# Patient Record
Sex: Male | Born: 1945
Health system: Southern US, Community
[De-identification: ages and names within clinical notes are randomized; demographics above are authoritative.]

## PROBLEM LIST (undated history)

## (undated) DIAGNOSIS — I2510 Atherosclerotic heart disease of native coronary artery without angina pectoris: Secondary | ICD-10-CM

## (undated) DIAGNOSIS — E119 Type 2 diabetes mellitus without complications: Secondary | ICD-10-CM

## (undated) DIAGNOSIS — D649 Anemia, unspecified: Secondary | ICD-10-CM

## (undated) DIAGNOSIS — R0600 Dyspnea, unspecified: Secondary | ICD-10-CM

## (undated) HISTORY — PX: APPENDECTOMY: SHX54

## (undated) HISTORY — PX: CORONARY ANGIOPLASTY: SHX604

## (undated) HISTORY — PX: CARDIAC CATHETERIZATION: SHX172

---

## 2011-09-16 ENCOUNTER — Emergency Department (HOSPITAL_COMMUNITY): Payer: Medicare Other

## 2011-09-16 ENCOUNTER — Encounter (HOSPITAL_COMMUNITY): Payer: Self-pay | Admitting: Emergency Medicine

## 2011-09-16 ENCOUNTER — Emergency Department (HOSPITAL_COMMUNITY)
Admission: EM | Admit: 2011-09-16 | Discharge: 2011-09-16 | Disposition: A | Discharge status: Home / Self Care | Payer: Medicare Other | Attending: Emergency Medicine | Admitting: Emergency Medicine

## 2011-09-16 DIAGNOSIS — Z9089 Acquired absence of other organs: Secondary | ICD-10-CM | POA: Insufficient documentation

## 2011-09-16 DIAGNOSIS — Y92410 Unspecified street and highway as the place of occurrence of the external cause: Secondary | ICD-10-CM | POA: Insufficient documentation

## 2011-09-16 DIAGNOSIS — I10 Essential (primary) hypertension: Secondary | ICD-10-CM | POA: Insufficient documentation

## 2011-09-16 DIAGNOSIS — I160 Hypertensive urgency: Secondary | ICD-10-CM

## 2011-09-16 LAB — BASIC METABOLIC PANEL
BUN: 11 mg/dL (ref 6–23)
Calcium: 9.5 mg/dL (ref 8.4–10.5)
Creatinine, Ser: 0.79 mg/dL (ref 0.50–1.35)
GFR calc non Af Amer: >90 mL/min (ref >90)
Glucose, Bld: 109 mg/dL — ABNORMAL HIGH (ref 70–99)
Potassium: 3.8 mEq/L (ref 3.5–5.1)

## 2011-09-16 LAB — CBC WITH DIFFERENTIAL/PLATELET
Eosinophils Absolute: 0.1 10*3/uL (ref 0.0–0.7)
Eosinophils Relative: 1 % (ref 0–5)
Hemoglobin: 12 g/dL — ABNORMAL LOW (ref 13.0–17.0)
Lymphs Abs: 2 10*3/uL (ref 0.7–4.0)
MCH: 26.8 pg (ref 26.0–34.0)
MCV: 80.8 fL (ref 78.0–100.0)
Monocytes Absolute: 0.6 10*3/uL (ref 0.1–1.0)
Monocytes Relative: 8 % (ref 3–12)
RBC: 4.47 MIL/uL (ref 4.22–5.81)

## 2011-09-16 LAB — URINALYSIS, ROUTINE W REFLEX MICROSCOPIC
Bilirubin Urine: NEGATIVE
Hgb urine dipstick: NEGATIVE
Specific Gravity, Urine: 1.023 (ref 1.005–1.030)
Urobilinogen, UA: 0.2 mg/dL (ref 0.0–1.0)

## 2011-09-16 MED ORDER — CLONIDINE HCL 0.1 MG PO TABS: 0.1000 mg | ORAL_TABLET | Freq: Once | ORAL | Status: DC

## 2011-09-16 MED ORDER — MORPHINE SULFATE 4 MG/ML IJ SOLN
4.0000 mg | Freq: Once | INTRAMUSCULAR | Status: AC
  Administered 2011-09-16: 4 mg via INTRAVENOUS
  Filled 2011-09-16: qty 1

## 2011-09-16 MED ORDER — CLONIDINE HCL 0.1 MG PO TABS
0.2000 mg | ORAL_TABLET | Freq: Once | ORAL | Status: AC
  Administered 2011-09-16: 0.2 mg via ORAL
  Filled 2011-09-16: qty 2

## 2011-09-16 MED ORDER — LISINOPRIL 20 MG PO TABS: 10.0000 mg | ORAL_TABLET | Freq: Every day | ORAL | Status: DC
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
## 2011-09-16 NOTE — ED Notes (Signed)
RN to obtain labs with start of IV 
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
## 2014-08-18 ENCOUNTER — Other Ambulatory Visit: Payer: Self-pay | Admitting: Cardiovascular Disease

## 2014-08-18 DIAGNOSIS — R1031 Right lower quadrant pain: Secondary | ICD-10-CM

## 2014-08-19 ENCOUNTER — Ambulatory Visit
Admission: RE | Admit: 2014-08-19 | Discharge: 2014-08-19 | Disposition: A | Discharge status: Home / Self Care | Payer: Medicare Other | Source: Ambulatory Visit | Attending: Cardiovascular Disease | Admitting: Cardiovascular Disease

## 2014-08-19 DIAGNOSIS — R1031 Right lower quadrant pain: Secondary | ICD-10-CM

## 2016-10-17 ENCOUNTER — Inpatient Hospital Stay (HOSPITAL_COMMUNITY)
Admission: AD | Admit: 2016-10-17 | Discharge: 2016-10-19 | DRG: 247 | Disposition: A | Discharge status: Home / Self Care | Payer: Medicare Other | Source: Ambulatory Visit | Attending: Cardiovascular Disease | Admitting: Cardiovascular Disease

## 2016-10-17 DIAGNOSIS — Z79899 Other long term (current) drug therapy: Secondary | ICD-10-CM

## 2016-10-17 DIAGNOSIS — Z87891 Personal history of nicotine dependence: Secondary | ICD-10-CM

## 2016-10-17 DIAGNOSIS — R072 Precordial pain: Secondary | ICD-10-CM

## 2016-10-17 DIAGNOSIS — I249 Acute ischemic heart disease, unspecified: Secondary | ICD-10-CM | POA: Diagnosis present

## 2016-10-17 DIAGNOSIS — E119 Type 2 diabetes mellitus without complications: Secondary | ICD-10-CM | POA: Diagnosis present

## 2016-10-17 DIAGNOSIS — Z8249 Family history of ischemic heart disease and other diseases of the circulatory system: Secondary | ICD-10-CM

## 2016-10-17 DIAGNOSIS — I25110 Atherosclerotic heart disease of native coronary artery with unstable angina pectoris: Principal | ICD-10-CM | POA: Diagnosis present

## 2016-10-17 DIAGNOSIS — R9439 Abnormal result of other cardiovascular function study: Secondary | ICD-10-CM | POA: Diagnosis present

## 2016-10-17 DIAGNOSIS — Z823 Family history of stroke: Secondary | ICD-10-CM

## 2016-10-17 DIAGNOSIS — I10 Essential (primary) hypertension: Secondary | ICD-10-CM

## 2016-10-17 DIAGNOSIS — Z955 Presence of coronary angioplasty implant and graft: Secondary | ICD-10-CM

## 2016-10-17 HISTORY — DX: Type 2 diabetes mellitus without complications: E11.9

## 2016-10-17 HISTORY — DX: Atherosclerotic heart disease of native coronary artery without angina pectoris: I25.10

## 2016-10-17 LAB — CBC WITH DIFFERENTIAL/PLATELET
Basophils Absolute: 0 10*3/uL (ref 0.0–0.1)
Basophils Relative: 0 %
EOS ABS: 0.1 10*3/uL (ref 0.0–0.7)
EOS PCT: 1 %
HCT: 34.1 % — ABNORMAL LOW (ref 39.0–52.0)
Hemoglobin: 11.4 g/dL — ABNORMAL LOW (ref 13.0–17.0)
LYMPHS ABS: 1.9 10*3/uL (ref 0.7–4.0)
Lymphocytes Relative: 23 %
MCH: 29.5 pg (ref 26.0–34.0)
MCHC: 33.4 g/dL (ref 30.0–36.0)
MCV: 88.3 fL (ref 78.0–100.0)
MONO ABS: 0.6 10*3/uL (ref 0.1–1.0)
MONOS PCT: 7 %
Neutro Abs: 5.6 10*3/uL (ref 1.7–7.7)
Neutrophils Relative %: 69 %
PLATELETS: 248 10*3/uL (ref 150–400)
RBC: 3.86 MIL/uL — ABNORMAL LOW (ref 4.22–5.81)
RDW: 15.5 % (ref 11.5–15.5)
WBC: 8.2 10*3/uL (ref 4.0–10.5)

## 2016-10-17 LAB — PROTIME-INR
INR: 1.01
Prothrombin Time: 13.3 seconds (ref 11.4–15.2)

## 2016-10-17 LAB — COMPREHENSIVE METABOLIC PANEL
ALK PHOS: 64 U/L (ref 38–126)
ALT: 14 U/L — AB (ref 17–63)
AST: 20 U/L (ref 15–41)
Albumin: 3.9 g/dL (ref 3.5–5.0)
Anion gap: 12 (ref 5–15)
BUN: 11 mg/dL (ref 6–20)
CALCIUM: 9.6 mg/dL (ref 8.9–10.3)
CO2: 20 mmol/L — AB (ref 22–32)
CREATININE: 0.95 mg/dL (ref 0.61–1.24)
Chloride: 107 mmol/L (ref 101–111)
GFR calc non Af Amer: >60 mL/min (ref >60)
GLUCOSE: 93 mg/dL (ref 65–99)
Potassium: 4.2 mmol/L (ref 3.5–5.1)
SODIUM: 139 mmol/L (ref 135–145)
Total Bilirubin: 0.5 mg/dL (ref 0.3–1.2)
Total Protein: 7.3 g/dL (ref 6.5–8.1)

## 2016-10-17 LAB — HEMOGLOBIN A1C
Hgb A1c MFr Bld: 6.2 % — ABNORMAL HIGH (ref 4.8–5.6)
MEAN PLASMA GLUCOSE: 131.24 mg/dL

## 2016-10-17 LAB — APTT: APTT: 30 s (ref 24–36)

## 2016-10-17 LAB — TROPONIN I: Troponin I: <0.03 ng/mL (ref <0.03)

## 2016-10-17 LAB — GLUCOSE, CAPILLARY: GLUCOSE-CAPILLARY: 109 mg/dL — AB (ref 65–99)

## 2016-10-17 MED ORDER — LOSARTAN POTASSIUM 50 MG PO TABS
100.0000 mg | ORAL_TABLET | Freq: Every day | ORAL | Status: DC
  Administered 2016-10-17 – 2016-10-19 (×3): 100 mg via ORAL
  Filled 2016-10-17 (×3): qty 2

## 2016-10-17 MED ORDER — ONDANSETRON HCL 4 MG/2ML IJ SOLN: 4.0000 mg | Freq: Four times a day (QID) | INTRAMUSCULAR | Status: DC | PRN

## 2016-10-17 MED ORDER — NITROGLYCERIN 0.4 MG SL SUBL: 0.4000 mg | SUBLINGUAL_TABLET | SUBLINGUAL | Status: DC | PRN

## 2016-10-17 MED ORDER — ASPIRIN 300 MG RE SUPP: 300.0000 mg | RECTAL | Status: AC

## 2016-10-17 MED ORDER — HEPARIN BOLUS VIA INFUSION
3000.0000 [IU] | Freq: Once | INTRAVENOUS | Status: AC
  Administered 2016-10-17: 3000 [IU] via INTRAVENOUS
  Filled 2016-10-17: qty 3000

## 2016-10-17 MED ORDER — HEPARIN (PORCINE) IN NACL 100-0.45 UNIT/ML-% IJ SOLN
950.0000 [IU]/h | INTRAMUSCULAR | Status: DC
  Administered 2016-10-17: 800 [IU]/h via INTRAVENOUS
  Filled 2016-10-17: qty 250

## 2016-10-17 MED ORDER — METOPROLOL TARTRATE 12.5 MG HALF TABLET
12.5000 mg | ORAL_TABLET | Freq: Two times a day (BID) | ORAL | Status: DC
  Administered 2016-10-17 – 2016-10-18 (×2): 12.5 mg via ORAL
  Filled 2016-10-17 (×3): qty 1

## 2016-10-17 MED ORDER — INSULIN ASPART 100 UNIT/ML ~~LOC~~ SOLN
0.0000 [IU] | Freq: Three times a day (TID) | SUBCUTANEOUS | Status: DC
  Administered 2016-10-18: 19:00:00 2 [IU] via SUBCUTANEOUS

## 2016-10-17 MED ORDER — ATORVASTATIN CALCIUM 40 MG PO TABS
40.0000 mg | ORAL_TABLET | Freq: Every day | ORAL | Status: DC
  Administered 2016-10-17 – 2016-10-18 (×2): 40 mg via ORAL
  Filled 2016-10-17 (×2): qty 1

## 2016-10-17 MED ORDER — ASPIRIN 81 MG PO CHEW
324.0000 mg | CHEWABLE_TABLET | ORAL | Status: AC
  Administered 2016-10-17: 324 mg via ORAL
  Filled 2016-10-17: qty 4

## 2016-10-17 MED ORDER — LISINOPRIL 10 MG PO TABS
10.0000 mg | ORAL_TABLET | Freq: Every day | ORAL | Status: DC
  Administered 2016-10-17: 10 mg via ORAL
  Filled 2016-10-17: qty 1

## 2016-10-17 MED ORDER — ASPIRIN EC 81 MG PO TBEC
81.0000 mg | DELAYED_RELEASE_TABLET | Freq: Every day | ORAL | Status: DC
  Administered 2016-10-18 – 2016-10-19 (×2): 81 mg via ORAL
  Filled 2016-10-17 (×2): qty 1

## 2016-10-17 MED ORDER — ACETAMINOPHEN 325 MG PO TABS: 650.0000 mg | ORAL_TABLET | ORAL | Status: DC | PRN

## 2016-10-17 NOTE — H&P (Signed)
Referring Physician:  PARLEY PIDCOCK is an 71 y.o. male.                       Chief Complaint: Chest pain  HPI: 71 year old male presented to office for chest pain evaluation. He underwent stress test showing 2 mm ST depression in inferior leads along with sweating and hypotension. He has past medical history of hypertension and DM, II.  Past medical history: DM, II since 2013, Hypertension since 2013. Quit smoking since 1995 with 30 pack year h/o of smoking., Quit alcohol use 1995. No drug use. No hyperlipidemia, No obesity, No MI, No FH of premature CAD.   Past Surgical History:  Procedure Laterality Date  . APPENDECTOMY-1975.      Family history: Mom died of stroke at age 70 years.                           Dad died of prostate cancer at age 87 years.                           He had 3 brothers, one living, one died of pneumonia complication at a young age and other one was found drunk and dead.                           He has 4 sisters and 3 are living with diabetes and hypertension and youngest one died of unknown cause.  Social History:  reports that he has quit smoking. He does not have any smokeless tobacco history on file. He reports that he does not drink alcohol or use drugs.  Allergies: Lisinopril - cough.  Medications Prior to Admission  Medication Sig Dispense Refill  . lisinopril (PRINIVIL,ZESTRIL) 20 MG tablet Take 0.5 tablets (10 mg total) by mouth daily. 15 tablet 0    No results found for this or any previous visit (from the past 48 hour(s)). No results found.  Review Of Systems Constitutional: No fever, chills, weight loss or gain. Eyes: No vision change, wears glasses. No discharge or pain. Ears: No hearing loss, No tinnitus. Respiratory: No asthma, COPD, pneumonias. Positive shortness of breath. No hemoptysis. Cardiovascular: Occasional chest pain, no palpitation, leg edema. Gastrointestinal: No nausea, vomiting, diarrhea, constipation. No GI bleed. No  hepatitis. Genitourinary: No dysuria, hematuria, kidney stone. No incontinance. Neurological: No headache, stroke, seizures.  Psychiatry: No psych facility admission for anxiety, depression, suicide. No detox. Skin: No rash. Musculoskeletal: Positive joint pain, no fibromyalgia. No neck pain, positive back pain. Lymphadenopathy: No lymphadenopathy. Hematology: No anemia or easy bruising.   Blood pressure (!) 154/78, pulse 62, temperature 98.3 F (36.8 C), temperature source Oral, resp. rate 18, height 5\' 4"  (1.626 m), weight 64.5 kg (142 lb 3.2 oz), SpO2 100 %. Body mass index is 24.41 kg/m. General appearance: alert, cooperative, appears stated age and no distress Head: Normocephalic, atraumatic. Eyes: Brown eyes, pink conjunctiva, corneas clear. PERRL, EOM's intact. Neck: No adenopathy, no carotid bruit, no JVD, supple, symmetrical, trachea midline and thyroid not enlarged. Resp: Clear to auscultation bilaterally. Cardio: Regular rate and rhythm, S1, S2 normal, II/VI systolic murmur, no click, rub or gallop GI: Soft, non-tender; bowel sounds normal; no organomegaly. Extremities: No edema, cyanosis or clubbing. Skin: Warm and dry.  Neurologic: Alert and oriented X 3, normal strength. Normal coordination and gait.  Assessment/Plan Acute  coronary syndrome Hypertension DM, II H/O tobacco use disorder  Place in observation. R/O MI Cardiac catheterization in AM.  Birdie Riddle, MD  10/17/2016, 4:21 PM

## 2016-10-17 NOTE — Progress Notes (Signed)
ANTICOAGULATION CONSULT NOTE - Initial Consult  Pharmacy Consult for heparin Indication: chest pain/ACS  No Known Allergies  Patient Measurements: Height: 5\' 4"  (162.6 cm) Weight: 142 lb 3.2 oz (64.5 kg) IBW/kg (Calculated) : 59.2 Heparin Dosing Weight: 64.5 kg  Vital Signs: Temp: 98.3 F (36.8 C) (08/27 1521) Temp Source: Oral (08/27 1521) BP: 154/78 (08/27 1521) Pulse Rate: 62 (08/27 1521)  Labs: No results for input(s): HGB, HCT, PLT, APTT, LABPROT, INR, HEPARINUNFRC, HEPRLOWMOCWT, CREATININE, CKTOTAL, CKMB, TROPONINI in the last 72 hours.  Labs pending.  CBC and BMET were normal July 2013 admission.   CrCl cannot be calculated (Patient's most recent lab result is older than the maximum 21 days allowed.).   Medical History: HTN  Medications:  Prescriptions Prior to Admission  Medication Sig Dispense Refill Last Dose  . lisinopril (PRINIVIL,ZESTRIL) 20 MG tablet Take 0.5 tablets (10 mg total) by mouth daily. 15 tablet 0     Assessment: 71 yo M presented with CP.  Pharmacy asked to start heparin for rule-out MI.  Plans for cardiac cath 8/28.  Goal of Therapy:  Heparin level 0.3-0.7 units/ml Monitor platelets by anticoagulation protocol: Yes   Plan:  Follow-up baseline labs. Heparin 3000 units IV bolus x 1 Heparin infusion at 800 units/hr Heparin level in 6 hours Heparin level and CBC daily while on heparin  [employer], Pharm.D., BCPS Clinical Pharmacist 10/17/2016 4:44 PM

## 2016-10-18 ENCOUNTER — Encounter (HOSPITAL_COMMUNITY): Payer: Self-pay

## 2016-10-18 ENCOUNTER — Encounter (HOSPITAL_COMMUNITY)
Admission: AD | Disposition: A | Discharge status: Home / Self Care | Payer: Self-pay | Source: Ambulatory Visit | Attending: Cardiovascular Disease

## 2016-10-18 DIAGNOSIS — E119 Type 2 diabetes mellitus without complications: Secondary | ICD-10-CM | POA: Diagnosis present

## 2016-10-18 DIAGNOSIS — Z823 Family history of stroke: Secondary | ICD-10-CM | POA: Diagnosis not present

## 2016-10-18 DIAGNOSIS — I25110 Atherosclerotic heart disease of native coronary artery with unstable angina pectoris: Secondary | ICD-10-CM | POA: Diagnosis present

## 2016-10-18 DIAGNOSIS — I10 Essential (primary) hypertension: Secondary | ICD-10-CM | POA: Diagnosis present

## 2016-10-18 DIAGNOSIS — Z79899 Other long term (current) drug therapy: Secondary | ICD-10-CM | POA: Diagnosis not present

## 2016-10-18 DIAGNOSIS — Z8249 Family history of ischemic heart disease and other diseases of the circulatory system: Secondary | ICD-10-CM | POA: Diagnosis not present

## 2016-10-18 DIAGNOSIS — R9439 Abnormal result of other cardiovascular function study: Secondary | ICD-10-CM | POA: Diagnosis present

## 2016-10-18 DIAGNOSIS — Z955 Presence of coronary angioplasty implant and graft: Secondary | ICD-10-CM | POA: Diagnosis not present

## 2016-10-18 DIAGNOSIS — Z87891 Personal history of nicotine dependence: Secondary | ICD-10-CM | POA: Diagnosis not present

## 2016-10-18 DIAGNOSIS — R079 Chest pain, unspecified: Secondary | ICD-10-CM | POA: Diagnosis present

## 2016-10-18 HISTORY — PX: LEFT HEART CATH AND CORONARY ANGIOGRAPHY: CATH118249

## 2016-10-18 HISTORY — PX: CORONARY STENT INTERVENTION: CATH118234

## 2016-10-18 LAB — PROTIME-INR
INR: 1.11
PROTHROMBIN TIME: 14.3 s (ref 11.4–15.2)

## 2016-10-18 LAB — CBC
HEMATOCRIT: 33.3 % — AB (ref 39.0–52.0)
Hemoglobin: 10.9 g/dL — ABNORMAL LOW (ref 13.0–17.0)
MCH: 29.2 pg (ref 26.0–34.0)
MCHC: 32.7 g/dL (ref 30.0–36.0)
MCV: 89.3 fL (ref 78.0–100.0)
Platelets: 262 10*3/uL (ref 150–400)
RBC: 3.73 MIL/uL — ABNORMAL LOW (ref 4.22–5.81)
RDW: 15.2 % (ref 11.5–15.5)
WBC: 7 10*3/uL (ref 4.0–10.5)

## 2016-10-18 LAB — IRON AND TIBC
Iron: 88 ug/dL (ref 45–182)
SATURATION RATIOS: 26 % (ref 17.9–39.5)
TIBC: 336 ug/dL (ref 250–450)
UIBC: 248 ug/dL

## 2016-10-18 LAB — LIPID PANEL
CHOL/HDL RATIO: 3.1 ratio
Cholesterol: 153 mg/dL (ref 0–200)
HDL: 49 mg/dL (ref >40)
LDL Cholesterol: 72 mg/dL (ref 0–99)
TRIGLYCERIDES: 162 mg/dL — AB (ref <150)
VLDL: 32 mg/dL (ref 0–40)

## 2016-10-18 LAB — BASIC METABOLIC PANEL
ANION GAP: 9 (ref 5–15)
BUN: 12 mg/dL (ref 6–20)
CALCIUM: 9.3 mg/dL (ref 8.9–10.3)
CO2: 25 mmol/L (ref 22–32)
Chloride: 105 mmol/L (ref 101–111)
Creatinine, Ser: 1.01 mg/dL (ref 0.61–1.24)
Glucose, Bld: 111 mg/dL — ABNORMAL HIGH (ref 65–99)
Potassium: 3.9 mmol/L (ref 3.5–5.1)
Sodium: 139 mmol/L (ref 135–145)

## 2016-10-18 LAB — GLUCOSE, CAPILLARY
GLUCOSE-CAPILLARY: 89 mg/dL (ref 65–99)
GLUCOSE-CAPILLARY: 138 mg/dL — AB (ref 65–99)
Glucose-Capillary: 96 mg/dL (ref 65–99)
Glucose-Capillary: 105 mg/dL — ABNORMAL HIGH (ref 65–99)

## 2016-10-18 LAB — FERRITIN: FERRITIN: 26 ng/mL (ref 24–336)

## 2016-10-18 LAB — HEPARIN LEVEL (UNFRACTIONATED)
Heparin Unfractionated: 0.21 IU/mL — ABNORMAL LOW (ref 0.30–0.70)
Heparin Unfractionated: 0.35 IU/mL (ref 0.30–0.70)

## 2016-10-18 LAB — POCT ACTIVATED CLOTTING TIME: ACTIVATED CLOTTING TIME: 312 s

## 2016-10-18 LAB — TROPONIN I
Troponin I: <0.03 ng/mL (ref <0.03)
Troponin I: <0.03 ng/mL (ref <0.03)

## 2016-10-18 SURGERY — LEFT HEART CATH AND CORONARY ANGIOGRAPHY
Anesthesia: LOCAL

## 2016-10-18 MED ORDER — SODIUM CHLORIDE 0.9% FLUSH: 3.0000 mL | INTRAVENOUS | Status: DC | PRN

## 2016-10-18 MED ORDER — NITROGLYCERIN 1 MG/10 ML FOR IR/CATH LAB
INTRA_ARTERIAL | Status: AC
  Filled 2016-10-18: qty 10

## 2016-10-18 MED ORDER — TICAGRELOR 90 MG PO TABS
90.0000 mg | ORAL_TABLET | Freq: Two times a day (BID) | ORAL | Status: DC
  Administered 2016-10-18 – 2016-10-19 (×2): 90 mg via ORAL
  Filled 2016-10-18 (×2): qty 1

## 2016-10-18 MED ORDER — SODIUM CHLORIDE 0.9% FLUSH: 3.0000 mL | Freq: Two times a day (BID) | INTRAVENOUS | Status: DC

## 2016-10-18 MED ORDER — MIDAZOLAM HCL 2 MG/2ML IJ SOLN
INTRAMUSCULAR | Status: AC
  Filled 2016-10-18: qty 2

## 2016-10-18 MED ORDER — LIDOCAINE HCL 2 % IJ SOLN
INTRAMUSCULAR | Status: AC
  Filled 2016-10-18: qty 10

## 2016-10-18 MED ORDER — TICAGRELOR 90 MG PO TABS
ORAL_TABLET | ORAL | Status: AC
  Filled 2016-10-18: qty 2

## 2016-10-18 MED ORDER — NITROGLYCERIN 1 MG/10 ML FOR IR/CATH LAB
INTRA_ARTERIAL | Status: DC | PRN
  Administered 2016-10-18: 200 ug via INTRACORONARY

## 2016-10-18 MED ORDER — SODIUM CHLORIDE 0.9 % IV SOLN
INTRAVENOUS | Status: AC | PRN
  Administered 2016-10-18: 1.75 mg/kg/h via INTRAVENOUS

## 2016-10-18 MED ORDER — SODIUM CHLORIDE 0.9 % IV SOLN: 250.0000 mL | INTRAVENOUS | Status: DC | PRN

## 2016-10-18 MED ORDER — HEPARIN (PORCINE) IN NACL 2-0.9 UNIT/ML-% IJ SOLN
INTRAMUSCULAR | Status: AC | PRN
  Administered 2016-10-18: 1000 mL

## 2016-10-18 MED ORDER — IOPAMIDOL (ISOVUE-370) INJECTION 76%
INTRAVENOUS | Status: DC | PRN
  Administered 2016-10-18: 90 mL via INTRA_ARTERIAL
  Administered 2016-10-18: 75 mL via INTRA_ARTERIAL

## 2016-10-18 MED ORDER — IOPAMIDOL (ISOVUE-370) INJECTION 76%
INTRAVENOUS | Status: AC
  Filled 2016-10-18: qty 100

## 2016-10-18 MED ORDER — SODIUM CHLORIDE 0.9 % IV SOLN
INTRAVENOUS | Status: AC | PRN
  Administered 2016-10-18: 250 mL via INTRAVENOUS

## 2016-10-18 MED ORDER — ANGIOPLASTY BOOK
Freq: Once | Status: AC
  Administered 2016-10-18: 21:00:00
  Filled 2016-10-18: qty 1

## 2016-10-18 MED ORDER — NITROGLYCERIN IN D5W 200-5 MCG/ML-% IV SOLN
5.0000 ug/min | INTRAVENOUS | Status: DC
  Administered 2016-10-18: 5 ug/min via INTRAVENOUS
  Filled 2016-10-18: qty 250

## 2016-10-18 MED ORDER — FENTANYL CITRATE (PF) 100 MCG/2ML IJ SOLN
INTRAMUSCULAR | Status: AC
  Filled 2016-10-18: qty 2

## 2016-10-18 MED ORDER — LIDOCAINE HCL (PF) 1 % IJ SOLN
INTRAMUSCULAR | Status: DC | PRN
  Administered 2016-10-18: 18 mL

## 2016-10-18 MED ORDER — HEPARIN (PORCINE) IN NACL 2-0.9 UNIT/ML-% IJ SOLN
INTRAMUSCULAR | Status: AC
  Filled 2016-10-18: qty 1000

## 2016-10-18 MED ORDER — BIVALIRUDIN TRIFLUOROACETATE 250 MG IV SOLR
INTRAVENOUS | Status: AC
  Filled 2016-10-18: qty 250

## 2016-10-18 MED ORDER — SODIUM CHLORIDE 0.9 % IV SOLN
INTRAVENOUS | Status: DC
  Administered 2016-10-18: 01:00:00 via INTRAVENOUS

## 2016-10-18 MED ORDER — MIDAZOLAM HCL 2 MG/2ML IJ SOLN
INTRAMUSCULAR | Status: DC | PRN
  Administered 2016-10-18: 1 mg via INTRAVENOUS

## 2016-10-18 MED ORDER — BIVALIRUDIN BOLUS VIA INFUSION - CUPID
INTRAVENOUS | Status: DC | PRN
  Administered 2016-10-18: 47.85 mg via INTRAVENOUS

## 2016-10-18 MED ORDER — NITROGLYCERIN 1 MG/10 ML FOR IR/CATH LAB
INTRA_ARTERIAL | Status: DC | PRN
  Administered 2016-10-18 (×2): 150 ug via INTRACORONARY

## 2016-10-18 MED ORDER — ACTIVE PARTNERSHIP FOR HEALTH OF YOUR HEART BOOK
Freq: Once | Status: AC
  Administered 2016-10-18: 21:00:00
  Filled 2016-10-18: qty 1

## 2016-10-18 MED ORDER — MORPHINE SULFATE (PF) 4 MG/ML IV SOLN
2.0000 mg | Freq: Once | INTRAVENOUS | Status: AC
  Administered 2016-10-18: 2 mg via INTRAVENOUS
  Filled 2016-10-18: qty 1

## 2016-10-18 MED ORDER — ASPIRIN 81 MG PO CHEW
81.0000 mg | CHEWABLE_TABLET | Freq: Every day | ORAL | Status: DC
Start: 2016-10-18 — End: 2016-10-19
  Administered 2016-10-19: 81 mg via ORAL
  Filled 2016-10-18: qty 1

## 2016-10-18 MED ORDER — FENTANYL CITRATE (PF) 100 MCG/2ML IJ SOLN
INTRAMUSCULAR | Status: DC | PRN
  Administered 2016-10-18: 25 ug via INTRAVENOUS

## 2016-10-18 MED ORDER — TICAGRELOR 90 MG PO TABS
ORAL_TABLET | ORAL | Status: DC | PRN
  Administered 2016-10-18: 180 mg via ORAL

## 2016-10-18 MED ORDER — SODIUM CHLORIDE 0.9 % IV SOLN
INTRAVENOUS | Status: AC
  Administered 2016-10-18: 13:00:00 via INTRAVENOUS

## 2016-10-18 SURGICAL SUPPLY — 17 items
BALLN EMERGE MR 2.5X12 (BALLOONS) ×2
BALLN ~~LOC~~ EMERGE MR 3.5X15 (BALLOONS) ×2
BALLOON EMERGE MR 2.5X12 (BALLOONS) ×1 IMPLANT
BALLOON ~~LOC~~ EMERGE MR 3.5X15 (BALLOONS) ×1 IMPLANT
CATH INFINITI 5FR MULTPACK ANG (CATHETERS) ×2 IMPLANT
CATH VISTA GUIDE 6FR XBLAD3.5 (CATHETERS) ×2 IMPLANT
KIT ENCORE 26 ADVANTAGE (KITS) ×2 IMPLANT
KIT HEART LEFT (KITS) ×2 IMPLANT
PACK CARDIAC CATHETERIZATION (CUSTOM PROCEDURE TRAY) ×2 IMPLANT
SHEATH PINNACLE 5F 10CM (SHEATH) ×2 IMPLANT
SHEATH PINNACLE 6F 10CM (SHEATH) ×2 IMPLANT
STENT SIERRA 3.00 X 18 MM (Permanent Stent) ×2 IMPLANT
SYR MEDRAD MARK V 150ML (SYRINGE) ×2 IMPLANT
TRANSDUCER W/STOPCOCK (MISCELLANEOUS) ×2 IMPLANT
TUBING CIL FLEX 10 FLL-RA (TUBING) ×2 IMPLANT
WIRE EMERALD 3MM-J .035X150CM (WIRE) ×2 IMPLANT
WIRE HI TORQ BMW 190CM (WIRE) ×2 IMPLANT

## 2016-10-18 NOTE — Progress Notes (Signed)
Patient urinating small amounts of urine fairly frequently. When questioned, he stated that this happens sometimes.  He also reported pressure with urination.  Bladder scan showed urine residual of 584 cc.  Night RN will do I&O cath if patient does not void sufficiently while standing at 2000, at the conclusion of bedrest.

## 2016-10-18 NOTE — Progress Notes (Addendum)
ANTICOAGULATION CONSULT NOTE - Follow Up Consult  Pharmacy Consult for heparin Indication: chest pain/ACS  Labs:  Recent Labs  10/17/16 1623 10/17/16 1648 10/17/16 2309 10/18/16 0400  HGB 11.4*  --   --  10.9*  HCT 34.1*  --   --  33.3*  PLT 248  --   --  262  APTT  --  30  --   --   LABPROT  --  13.3  --  14.3  INR  --  1.01  --  1.11  HEPARINUNFRC  --   --  0.21* 0.35  CREATININE 0.95  --   --   --   TROPONINI <0.03  --  <0.03  --     Assessment: 71yo male subtherapeutic on heparin with initial dosing for CP.  Goal of Therapy:  Heparin level 0.3-0.7 units/ml   Plan:  Will increase heparin gtt by 2 units/kg/hr to 950 units/hr and check level with next lab draw.  Wynona Neat, PharmD, BCPS  10/18/2016,12:30 AM   ADDENDUM: Heparin level now at goal.  Will continue gtt at current rate and confirm stable with additional level.     VB   4:50 AM

## 2016-10-18 NOTE — Progress Notes (Signed)
Site area: right groin  Site Prior to Removal:  Level 0  Pressure Applied For 20 MINUTES    Minutes Beginning at 1550  Manual:   Yes.    Patient Status During Pull:  stable  Post Pull Groin Site:  Level 0  Post Pull Instructions Given:  Yes.    Post Pull Pulses Present:  Yes.    Dressing Applied:  Yes.    Comments:

## 2016-10-18 NOTE — Interval H&P Note (Signed)
History and Physical Interval Note:  10/18/2016 10:56 AM  Bryan Roy  has presented today for surgery, with the diagnosis of cp  The various methods of treatment have been discussed with the patient and family. After consideration of risks, benefits and other options for treatment, the patient has consented to  Procedure(s): LEFT HEART CATH AND CORONARY ANGIOGRAPHY (N/A) as a surgical intervention .  The patient's history has been reviewed, patient examined, no change in status, stable for surgery.  I have reviewed the patient's chart and labs.  Questions were answered to the patient's satisfaction.     Virgal Warmuth S

## 2016-10-19 ENCOUNTER — Encounter (HOSPITAL_COMMUNITY): Payer: Self-pay | Admitting: Cardiovascular Disease

## 2016-10-19 LAB — BASIC METABOLIC PANEL
ANION GAP: 8 (ref 5–15)
BUN: 9 mg/dL (ref 6–20)
CHLORIDE: 106 mmol/L (ref 101–111)
CO2: 24 mmol/L (ref 22–32)
Calcium: 9.4 mg/dL (ref 8.9–10.3)
Creatinine, Ser: 0.89 mg/dL (ref 0.61–1.24)
GFR calc non Af Amer: >60 mL/min (ref >60)
Glucose, Bld: 107 mg/dL — ABNORMAL HIGH (ref 65–99)
Potassium: 4.2 mmol/L (ref 3.5–5.1)
Sodium: 138 mmol/L (ref 135–145)

## 2016-10-19 LAB — CBC
HEMATOCRIT: 35.8 % — AB (ref 39.0–52.0)
HEMOGLOBIN: 11.8 g/dL — AB (ref 13.0–17.0)
MCH: 29.1 pg (ref 26.0–34.0)
MCHC: 33 g/dL (ref 30.0–36.0)
MCV: 88.4 fL (ref 78.0–100.0)
Platelets: 260 10*3/uL (ref 150–400)
RBC: 4.05 MIL/uL — AB (ref 4.22–5.81)
RDW: 15.3 % (ref 11.5–15.5)
WBC: 8.7 10*3/uL (ref 4.0–10.5)

## 2016-10-19 LAB — GLUCOSE, CAPILLARY: Glucose-Capillary: 116 mg/dL — ABNORMAL HIGH (ref 65–99)

## 2016-10-19 MED ORDER — METFORMIN HCL 500 MG PO TABS: 500.0000 mg | ORAL_TABLET | Freq: Two times a day (BID) | ORAL | Status: DC

## 2016-10-19 MED ORDER — TICAGRELOR 90 MG PO TABS
90.0000 mg | ORAL_TABLET | Freq: Two times a day (BID) | ORAL | 12 refills | Status: DC
Start: 2016-10-19 — End: 2017-02-12

## 2016-10-19 MED ORDER — AMLODIPINE BESYLATE 5 MG PO TABS: 5.0000 mg | ORAL_TABLET | Freq: Every day | ORAL | Status: DC

## 2016-10-19 MED ORDER — NITROGLYCERIN 0.4 MG SL SUBL: 0.4000 mg | SUBLINGUAL_TABLET | SUBLINGUAL | 1 refills | Status: AC | PRN

## 2016-10-19 MED ORDER — ATORVASTATIN CALCIUM 40 MG PO TABS: 40.0000 mg | ORAL_TABLET | Freq: Every day | ORAL | 6 refills | Status: AC

## 2016-10-19 MED ORDER — ASPIRIN 81 MG PO TBEC: 81.0000 mg | DELAYED_RELEASE_TABLET | Freq: Every day | ORAL | Status: AC

## 2016-10-19 NOTE — Discharge Summary (Signed)
Physician Discharge Summary  Patient ID: Bryan Roy MRN: [id number] DOB/AGE: Apr 19, 1945 71 y.o.  Admit date: 10/17/2016 Discharge date: 10/19/2016  Admission Diagnoses: Acute coronary syndrome Hypertension DM, II H/O tobacco use disorder  Discharge Diagnoses:  Principal Problem: * Unstable angina * Active Problems:   CAD   S/P LAD stent   Essential hypertension   Abnormal stress test   DM, II   H/O tobacco use disorder  Discharged Condition: good  Hospital Course: 71 year old male underwent treadmill stress test showing ST depression along with sweating and hypertension. He was admitted for unstable angina. He had cardiac catheterization showing 70-80% left anterior descending coronary artery disease. He had a 3 mm x 18 mm drug-eluting stent placed (by Dr. Charolette Forward) and postdilated with 3.5 mm noncompliant balloon with excellent result. His post angioplasty care was unremarkable. He was started on aspirin and Brilinta along with a beta blocker and atorvastatin. He'll be followed by me in 1 week.  Consults: cardiology  Significant Diagnostic Studies: labs: Near normal CBC with a hemoglobin of 10.9-11.8. Near normal electrolytes and BUN/creatinine with a borderline blood glucose level of 107-111 mg. Serum iron level was normal. Lipid panel was near normal with LDL cholesterol of 72 mg.  Cardiac catheterization showed multivessel coronary artery disease with a severe disease of left anterior descending coronary artery with TIMI grade 3 flow post 3 mm x 18 mm drug-eluting stent placement.   EKG showed sinus rhythm.  Treatments: cardiac meds: metoprolol, amlodipine, aspirin, Brilinta and atorvastatin.  Discharge Exam: Blood pressure (!) 177/75, pulse (!) 52, temperature 98.1 F (36.7 C), temperature source Oral, resp. rate 10, height 5\' 4"  (1.626 m), weight 65.5 kg (144 lb 6.4 oz), SpO2 99 %. General appearance: alert, cooperative and appears stated age. Head:  Normocephalic, atraumatic. Eyes: Brown eyes, pink conjunctiva, corneas clear. PERRL, EOM's intact.  Neck: No adenopathy, no carotid bruit, no JVD, supple, symmetrical, trachea midline and thyroid not enlarged. Resp: Clear to auscultation bilaterally. Cardio: Regular rate and rhythm, S1, S2 normal, II/VI systolic murmur, no click, rub or gallop. GI: Soft, non-tender; bowel sounds normal; no organomegaly. Extremities: No edema, cyanosis or clubbing. No right groin hematoma. Skin: Warm and dry.  Neurologic: Alert and oriented X 3, normal strength and tone. Normal coordination and gait.  Disposition: 01-Home or Self Care  Discharge Instructions    AMB Referral to Cardiac Rehabilitation - Phase II    Complete by:  As directed    Diagnosis:  Coronary Stents     Allergies as of 10/19/2016   No Known Allergies     Medication List    STOP taking these medications   lisinopril 20 MG tablet Commonly known as:  PRINIVIL,ZESTRIL     TAKE these medications   amLODipine 5 MG tablet Commonly known as:  NORVASC Take 1 tablet (5 mg total) by mouth daily. What changed:  when to take this   aspirin 81 MG EC tablet Take 1 tablet (81 mg total) by mouth daily.   atorvastatin 40 MG tablet Commonly known as:  LIPITOR Take 1 tablet (40 mg total) by mouth daily at 6 PM.   hydrALAZINE 25 MG tablet Commonly known as:  APRESOLINE Take 25 mg by mouth 2 (two) times daily.   HYDROcodone-acetaminophen 5-325 MG tablet Commonly known as:  NORCO/VICODIN Take 1 tablet by mouth daily as needed for moderate pain.   losartan 100 MG tablet Commonly known as:  COZAAR Take 100 mg by mouth daily.   metFORMIN  500 MG tablet Commonly known as:  GLUCOPHAGE Take 1 tablet (500 mg total) by mouth 2 (two) times daily with a meal. Start after 2 days. What changed:  additional instructions   metoprolol tartrate 25 MG tablet Commonly known as:  LOPRESSOR Take 25 mg by mouth 2 (two) times daily.   nitroGLYCERIN  0.4 MG SL tablet Commonly known as:  NITROSTAT Place 1 tablet (0.4 mg total) under the tongue every 5 (five) minutes x 3 doses as needed for chest pain.   ticagrelor 90 MG Tabs tablet Commonly known as:  BRILINTA Take 1 tablet (90 mg total) by mouth 2 (two) times daily.            Discharge Care Instructions        Start     Ordered   10/19/16 0000  aspirin EC 81 MG EC tablet  Daily     10/19/16 0908   10/19/16 0000  metFORMIN (GLUCOPHAGE) 500 MG tablet  2 times daily with meals     10/19/16 0908   10/19/16 0000  nitroGLYCERIN (NITROSTAT) 0.4 MG SL tablet  Every 5 min x3 PRN     10/19/16 0908   10/19/16 0000  ticagrelor (BRILINTA) 90 MG TABS tablet  2 times daily     10/19/16 0908   10/19/16 0000  atorvastatin (LIPITOR) 40 MG tablet  Daily-1800     10/19/16 0908   10/19/16 0000  amLODipine (NORVASC) 5 MG tablet  Daily     10/19/16 0908   10/18/16 0000  AMB Referral to Cardiac Rehabilitation - Phase II    Question:  Diagnosis:  Answer:  Coronary Stents   10/18/16 1232     Follow-up Information    Dixie Dials, MD. Schedule an appointment as soon as possible for a visit in 1 week(s).   Specialty:  Cardiology Contact information: [place] [address] [phone]           Signed: Birdie Riddle 10/19/2016, 9:22 AM

## 2016-10-19 NOTE — Progress Notes (Signed)
CARDIAC REHAB PHASE I   PRE:  Rate/Rhythm: 62 sR    BP: sitting 175/70    SaO2:   MODE:  Ambulation: 500 ft   POST:  Rate/Rhythm: 82 SR    BP: sitting 177/74     SaO2:   Tolerated well, no c/o. BP elevated. Ed completed with pt. He understands the importance of Brilinta but needs CM as his pharmacy is calling and stating it is $400. Dr. Doylene Canard says he has samples in the office. Will refer pt to [facility] in [place].  [place], ACSM 10/19/2016 9:50 AM

## 2016-10-19 NOTE — Care Management Note (Addendum)
Case Management Note  Patient Details  Name: OSRIC KLOPF MRN: [id number] Date of Birth: [birth year]  Subjective/Objective:     From home , s/p coronary stent intervention, will be on brilinta, NCM awaiting benefit check..  NCM gave patient the 30 day free coupon. Patient has no medication coverage,  NCM spoke with Dr. Doylene Canard , he would like for patient to get the 30 day free , He will go to [facility] on [address],  Then at the follow up apt, he will get samples from Dr. Doylene Canard office.  NCM gave him the patient assistance application to fill out and take to Dr. Merrilee Jansky office for him to fill out to see if he can get assistance with getting the brilinta.                Action/Plan: NCM will follow for dc needs.   Expected Discharge Date:  10/19/16               Expected Discharge Plan:  Home/Self Care  In-House Referral:     Discharge planning Services  CM Consult  Post Acute Care Choice:    Choice offered to:     DME Arranged:    DME Agency:     HH Arranged:    HH Agency:     Status of Service:  Completed, signed off  If discussed at [employer] of Stay Meetings, dates discussed:    Additional Comments:  Zenon Mayo, RN 10/19/2016, 9:24 AM

## 2016-10-20 ENCOUNTER — Telehealth (HOSPITAL_COMMUNITY): Payer: Self-pay

## 2016-10-20 NOTE — Telephone Encounter (Signed)
Patient insurance is active and benefits verified. Patient insurance is Medicare A/B - no co-payment, deductible $183.00/$183.00 has been met, 20% co-insurance, no out of pocket and no pre-authorization. Passport/reference [phone].  Patient will be contacted and scheduled after we obtain appointment information (TBD) with Dr. Doylene Canard and records are received from the office visit.

## 2016-12-27 DIAGNOSIS — Z79899 Other long term (current) drug therapy: Secondary | ICD-10-CM | POA: Diagnosis not present

## 2016-12-27 DIAGNOSIS — E119 Type 2 diabetes mellitus without complications: Secondary | ICD-10-CM | POA: Diagnosis not present

## 2016-12-27 DIAGNOSIS — D649 Anemia, unspecified: Secondary | ICD-10-CM | POA: Diagnosis not present

## 2016-12-27 DIAGNOSIS — E876 Hypokalemia: Secondary | ICD-10-CM | POA: Diagnosis not present

## 2016-12-27 DIAGNOSIS — E785 Hyperlipidemia, unspecified: Secondary | ICD-10-CM | POA: Diagnosis not present

## 2017-01-21 DIAGNOSIS — D649 Anemia, unspecified: Secondary | ICD-10-CM

## 2017-01-21 HISTORY — DX: Anemia, unspecified: D64.9

## 2017-02-06 ENCOUNTER — Inpatient Hospital Stay (HOSPITAL_COMMUNITY): Payer: Medicare HMO

## 2017-02-06 ENCOUNTER — Inpatient Hospital Stay (HOSPITAL_COMMUNITY)
Admission: AD | Admit: 2017-02-06 | Discharge: 2017-02-12 | DRG: 812 | Disposition: A | Discharge status: Home / Self Care | Payer: Medicare HMO | Source: Ambulatory Visit | Attending: Cardiovascular Disease | Admitting: Cardiovascular Disease

## 2017-02-06 ENCOUNTER — Other Ambulatory Visit: Payer: Self-pay

## 2017-02-06 DIAGNOSIS — D500 Iron deficiency anemia secondary to blood loss (chronic): Secondary | ICD-10-CM | POA: Diagnosis present

## 2017-02-06 DIAGNOSIS — K5730 Diverticulosis of large intestine without perforation or abscess without bleeding: Secondary | ICD-10-CM | POA: Diagnosis present

## 2017-02-06 DIAGNOSIS — E785 Hyperlipidemia, unspecified: Secondary | ICD-10-CM | POA: Diagnosis not present

## 2017-02-06 DIAGNOSIS — R0602 Shortness of breath: Secondary | ICD-10-CM | POA: Diagnosis present

## 2017-02-06 DIAGNOSIS — K648 Other hemorrhoids: Secondary | ICD-10-CM | POA: Diagnosis present

## 2017-02-06 DIAGNOSIS — I10 Essential (primary) hypertension: Secondary | ICD-10-CM | POA: Diagnosis not present

## 2017-02-06 DIAGNOSIS — Z7901 Long term (current) use of anticoagulants: Secondary | ICD-10-CM | POA: Diagnosis not present

## 2017-02-06 DIAGNOSIS — Z7982 Long term (current) use of aspirin: Secondary | ICD-10-CM

## 2017-02-06 DIAGNOSIS — M545 Low back pain: Secondary | ICD-10-CM | POA: Diagnosis not present

## 2017-02-06 DIAGNOSIS — E1140 Type 2 diabetes mellitus with diabetic neuropathy, unspecified: Secondary | ICD-10-CM | POA: Diagnosis not present

## 2017-02-06 DIAGNOSIS — K388 Other specified diseases of appendix: Secondary | ICD-10-CM | POA: Diagnosis not present

## 2017-02-06 DIAGNOSIS — K3189 Other diseases of stomach and duodenum: Secondary | ICD-10-CM | POA: Diagnosis not present

## 2017-02-06 DIAGNOSIS — Z9861 Coronary angioplasty status: Secondary | ICD-10-CM | POA: Diagnosis not present

## 2017-02-06 DIAGNOSIS — Z79899 Other long term (current) drug therapy: Secondary | ICD-10-CM | POA: Diagnosis not present

## 2017-02-06 DIAGNOSIS — K649 Unspecified hemorrhoids: Secondary | ICD-10-CM | POA: Diagnosis not present

## 2017-02-06 DIAGNOSIS — K2100 Gastro-esophageal reflux disease with esophagitis, without bleeding: Secondary | ICD-10-CM | POA: Diagnosis present

## 2017-02-06 DIAGNOSIS — E119 Type 2 diabetes mellitus without complications: Secondary | ICD-10-CM | POA: Diagnosis not present

## 2017-02-06 DIAGNOSIS — I2510 Atherosclerotic heart disease of native coronary artery without angina pectoris: Secondary | ICD-10-CM | POA: Diagnosis not present

## 2017-02-06 DIAGNOSIS — D490 Neoplasm of unspecified behavior of digestive system: Secondary | ICD-10-CM | POA: Diagnosis not present

## 2017-02-06 DIAGNOSIS — D509 Iron deficiency anemia, unspecified: Principal | ICD-10-CM | POA: Diagnosis present

## 2017-02-06 DIAGNOSIS — K319 Disease of stomach and duodenum, unspecified: Secondary | ICD-10-CM | POA: Diagnosis present

## 2017-02-06 DIAGNOSIS — K259 Gastric ulcer, unspecified as acute or chronic, without hemorrhage or perforation: Secondary | ICD-10-CM | POA: Diagnosis present

## 2017-02-06 DIAGNOSIS — Z87891 Personal history of nicotine dependence: Secondary | ICD-10-CM | POA: Diagnosis not present

## 2017-02-06 DIAGNOSIS — Z7902 Long term (current) use of antithrombotics/antiplatelets: Secondary | ICD-10-CM | POA: Diagnosis not present

## 2017-02-06 DIAGNOSIS — R531 Weakness: Secondary | ICD-10-CM | POA: Diagnosis not present

## 2017-02-06 DIAGNOSIS — Z955 Presence of coronary angioplasty implant and graft: Secondary | ICD-10-CM

## 2017-02-06 DIAGNOSIS — N400 Enlarged prostate without lower urinary tract symptoms: Secondary | ICD-10-CM | POA: Diagnosis not present

## 2017-02-06 DIAGNOSIS — K2960 Other gastritis without bleeding: Secondary | ICD-10-CM | POA: Diagnosis present

## 2017-02-06 DIAGNOSIS — D175 Benign lipomatous neoplasm of intra-abdominal organs: Secondary | ICD-10-CM | POA: Diagnosis not present

## 2017-02-06 DIAGNOSIS — K5790 Diverticulosis of intestine, part unspecified, without perforation or abscess without bleeding: Secondary | ICD-10-CM | POA: Diagnosis not present

## 2017-02-06 HISTORY — DX: Dyspnea, unspecified: R06.00

## 2017-02-06 HISTORY — DX: Anemia, unspecified: D64.9

## 2017-02-06 LAB — CBC WITH DIFFERENTIAL/PLATELET
BASOS ABS: 0.1 10*3/uL (ref 0.0–0.1)
BASOS PCT: 1 %
EOS ABS: 0.1 10*3/uL (ref 0.0–0.7)
EOS PCT: 1 %
HCT: 21.9 % — ABNORMAL LOW (ref 39.0–52.0)
Hemoglobin: 6.7 g/dL — CL (ref 13.0–17.0)
LYMPHS PCT: 21 %
Lymphs Abs: 1.2 10*3/uL (ref 0.7–4.0)
MCH: 23.9 pg — ABNORMAL LOW (ref 26.0–34.0)
MCHC: 30.6 g/dL (ref 30.0–36.0)
MCV: 78.2 fL (ref 78.0–100.0)
MONO ABS: 0.4 10*3/uL (ref 0.1–1.0)
Monocytes Relative: 8 %
Neutro Abs: 4.1 10*3/uL (ref 1.7–7.7)
Neutrophils Relative %: 69 %
PLATELETS: 377 10*3/uL (ref 150–400)
RBC: 2.8 MIL/uL — ABNORMAL LOW (ref 4.22–5.81)
RDW: 16.6 % — AB (ref 11.5–15.5)
WBC: 5.8 10*3/uL (ref 4.0–10.5)

## 2017-02-06 LAB — IRON AND TIBC
Iron: 13 ug/dL — ABNORMAL LOW (ref 45–182)
Saturation Ratios: 3 % — ABNORMAL LOW (ref 17.9–39.5)
TIBC: 511 ug/dL — ABNORMAL HIGH (ref 250–450)
UIBC: 498 ug/dL

## 2017-02-06 LAB — PROTIME-INR
INR: 1.11
PROTHROMBIN TIME: 14.3 s (ref 11.4–15.2)

## 2017-02-06 LAB — COMPREHENSIVE METABOLIC PANEL
ALBUMIN: 3.7 g/dL (ref 3.5–5.0)
ALK PHOS: 103 U/L (ref 38–126)
ALT: 30 U/L (ref 17–63)
AST: 21 U/L (ref 15–41)
Anion gap: 9 (ref 5–15)
BILIRUBIN TOTAL: 0.6 mg/dL (ref 0.3–1.2)
BUN: 7 mg/dL (ref 6–20)
CALCIUM: 9.6 mg/dL (ref 8.9–10.3)
CO2: 22 mmol/L (ref 22–32)
CREATININE: 0.86 mg/dL (ref 0.61–1.24)
Chloride: 108 mmol/L (ref 101–111)
GFR calc Af Amer: >60 mL/min (ref >60)
GLUCOSE: 115 mg/dL — AB (ref 65–99)
Potassium: 4.7 mmol/L (ref 3.5–5.1)
Sodium: 139 mmol/L (ref 135–145)
TOTAL PROTEIN: 7.3 g/dL (ref 6.5–8.1)

## 2017-02-06 LAB — PREPARE RBC (CROSSMATCH)

## 2017-02-06 LAB — APTT: APTT: 32 s (ref 24–36)

## 2017-02-06 LAB — FERRITIN: FERRITIN: 4 ng/mL — AB (ref 24–336)

## 2017-02-06 MED ORDER — PANTOPRAZOLE SODIUM 40 MG PO TBEC: 40.0000 mg | DELAYED_RELEASE_TABLET | Freq: Every day | ORAL | Status: DC | PRN

## 2017-02-06 MED ORDER — SODIUM CHLORIDE 0.9 % IV SOLN: 250.0000 mL | INTRAVENOUS | Status: DC | PRN

## 2017-02-06 MED ORDER — SODIUM CHLORIDE 0.9% FLUSH: 3.0000 mL | INTRAVENOUS | Status: DC | PRN

## 2017-02-06 MED ORDER — ATORVASTATIN CALCIUM 40 MG PO TABS
40.0000 mg | ORAL_TABLET | Freq: Every day | ORAL | Status: DC
  Administered 2017-02-06 – 2017-02-11 (×6): 40 mg via ORAL
  Filled 2017-02-06 (×6): qty 1

## 2017-02-06 MED ORDER — METOPROLOL TARTRATE 12.5 MG HALF TABLET
12.5000 mg | ORAL_TABLET | Freq: Two times a day (BID) | ORAL | Status: DC
Start: 2017-02-06 — End: 2017-02-12
  Administered 2017-02-06 – 2017-02-12 (×11): 12.5 mg via ORAL
  Filled 2017-02-06 (×11): qty 1

## 2017-02-06 MED ORDER — NITROGLYCERIN 0.4 MG SL SUBL
0.4000 mg | SUBLINGUAL_TABLET | SUBLINGUAL | Status: DC | PRN
Start: 2017-02-06 — End: 2017-02-12

## 2017-02-06 MED ORDER — AMLODIPINE BESYLATE 5 MG PO TABS
5.0000 mg | ORAL_TABLET | Freq: Two times a day (BID) | ORAL | Status: DC
  Administered 2017-02-06 – 2017-02-12 (×11): 5 mg via ORAL
  Filled 2017-02-06 (×11): qty 1

## 2017-02-06 MED ORDER — SODIUM CHLORIDE 0.9% FLUSH
3.0000 mL | Freq: Two times a day (BID) | INTRAVENOUS | Status: DC
  Administered 2017-02-06 – 2017-02-12 (×9): 3 mL via INTRAVENOUS

## 2017-02-06 MED ORDER — LOSARTAN POTASSIUM 50 MG PO TABS
100.0000 mg | ORAL_TABLET | Freq: Every day | ORAL | Status: DC
  Administered 2017-02-07 – 2017-02-12 (×5): 100 mg via ORAL
  Filled 2017-02-06 (×5): qty 2

## 2017-02-06 MED ORDER — HYDRALAZINE HCL 25 MG PO TABS
25.0000 mg | ORAL_TABLET | Freq: Two times a day (BID) | ORAL | Status: DC
  Administered 2017-02-06 – 2017-02-12 (×11): 25 mg via ORAL
  Filled 2017-02-06 (×11): qty 1

## 2017-02-06 MED ORDER — SODIUM CHLORIDE 0.9 % IV SOLN: Freq: Once | INTRAVENOUS | Status: DC

## 2017-02-06 MED ORDER — METFORMIN HCL 500 MG PO TABS
250.0000 mg | ORAL_TABLET | Freq: Two times a day (BID) | ORAL | Status: DC
  Administered 2017-02-07 – 2017-02-08 (×4): 250 mg via ORAL
  Filled 2017-02-06 (×4): qty 1

## 2017-02-06 NOTE — H&P (Signed)
Referring Physician:  HIEP OLLIS is an 71 y.o. male.                       Chief Complaint: Shortness of breath  HPI: 71 years old male has 1 week history of shortness of breath with activity. He denies fever, cough or chest pain. He has PMH of LAD stent in 09/2016, hypertension and type II DM.  Past Medical History:  Diagnosis Date  . Coronary artery disease   . DM (diabetes mellitus) ([place])       Past Surgical History:  Procedure Laterality Date  . APPENDECTOMY    . CARDIAC CATHETERIZATION    . CORONARY ANGIOPLASTY    . CORONARY STENT INTERVENTION N/A 10/18/2016   Procedure: CORONARY STENT INTERVENTION;  Surgeon: Dixie Dials, MD;  Location: [facility];  Service: Cardiovascular;  Laterality: N/A;  . LEFT HEART CATH AND CORONARY ANGIOGRAPHY N/A 10/18/2016   Procedure: LEFT HEART CATH AND CORONARY ANGIOGRAPHY;  Surgeon: Dixie Dials, MD;  Location: [facility];  Service: Cardiovascular;  Laterality: N/A;    No family history on file. Social History:  reports that he has quit smoking. He has quit using smokeless tobacco. He reports that he does not drink alcohol or use drugs.  Allergies: No Known Allergies  Medications Prior to Admission  Medication Sig Dispense Refill  . amLODipine (NORVASC) 5 MG tablet Take 1 tablet (5 mg total) by mouth daily.    Marland Kitchen aspirin EC 81 MG EC tablet Take 1 tablet (81 mg total) by mouth daily.    Marland Kitchen atorvastatin (LIPITOR) 40 MG tablet Take 1 tablet (40 mg total) by mouth daily at 6 PM. 30 tablet 6  . hydrALAZINE (APRESOLINE) 25 MG tablet Take 25 mg by mouth 2 (two) times daily.    Marland Kitchen HYDROcodone-acetaminophen (NORCO/VICODIN) 5-325 MG tablet Take 1 tablet by mouth daily as needed for moderate pain.    Marland Kitchen losartan (COZAAR) 100 MG tablet Take 100 mg by mouth daily.    . metFORMIN (GLUCOPHAGE) 500 MG tablet Take 1 tablet (500 mg total) by mouth 2 (two) times daily with a meal. Start after 2 days.    . metoprolol tartrate (LOPRESSOR) 25 MG  tablet Take 25 mg by mouth 2 (two) times daily.    . nitroGLYCERIN (NITROSTAT) 0.4 MG SL tablet Place 1 tablet (0.4 mg total) under the tongue every 5 (five) minutes x 3 doses as needed for chest pain. 25 tablet 1  . ticagrelor (BRILINTA) 90 MG TABS tablet Take 1 tablet (90 mg total) by mouth 2 (two) times daily. 60 tablet 12    No results found for this or any previous visit (from the past 48 hour(s)). No results found.  Review Of Systems Constitutional: No fever, chills, weight loss or gain. Eyes: No vision change, wears glasses. No discharge or pain. Ears: No hearing loss, No tinnitus. Respiratory: No asthma, COPD, pneumonias. Positive shortness of breath. No hemoptysis. Cardiovascular: Occasional chest pain, no palpitation, leg edema. Gastrointestinal: No nausea, vomiting, diarrhea, constipation. No GI bleed. No hepatitis. Genitourinary: No dysuria, hematuria, kidney stone. No incontinance. Neurological: No headache, stroke, seizures.  Psychiatry: No psych facility admission for anxiety, depression, suicide. No detox. Skin: No rash. Musculoskeletal: Positive joint pain, no fibromyalgia. No neck pain, back pain. Lymphadenopathy: No lymphadenopathy. Hematology: Positive anemia or easy bruising.   There were no vitals taken for this visit. There is no height or weight on file to calculate BMI.  General appearance: alert, cooperative, appears stated age and no distress Head: Normocephalic, atraumatic. Eyes: [place], conjunctiva-pale, Sclera-muddy, corneas clear. PERRL, EOM's intact. Neck: No adenopathy, no carotid bruit, no JVD, supple, symmetrical, trachea midline and thyroid not enlarged. Resp: Clear to auscultation bilaterally. Cardio: Regular rate and rhythm, S1, S2 normal, II/VI systolic murmur, no click, rub or gallop GI: Soft, non-tender; bowel sounds normal; no organomegaly. Extremities: No edema, cyanosis or clubbing. Skin: Warm and dry.  Neurologic: Alert and oriented X 3,  normal strength. Normal coordination and gait.  Assessment/Plan Shortness of breath DM, II Hypertension S/P stent in LAD  Admit Blood work CXR Hold Brilinta and Aspirin Possible GI consult.  Birdie Riddle, MD  02/06/2017, 10:38 AM
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
## 2017-02-06 NOTE — Progress Notes (Signed)
Lab called critical hbg 6.7.  MD notified, will continue to monitor.

## 2017-02-07 ENCOUNTER — Encounter (HOSPITAL_COMMUNITY): Payer: Self-pay

## 2017-02-07 DIAGNOSIS — Z7902 Long term (current) use of antithrombotics/antiplatelets: Secondary | ICD-10-CM

## 2017-02-07 DIAGNOSIS — Z7901 Long term (current) use of anticoagulants: Secondary | ICD-10-CM

## 2017-02-07 DIAGNOSIS — Z9861 Coronary angioplasty status: Secondary | ICD-10-CM

## 2017-02-07 DIAGNOSIS — D509 Iron deficiency anemia, unspecified: Principal | ICD-10-CM

## 2017-02-07 DIAGNOSIS — I2510 Atherosclerotic heart disease of native coronary artery without angina pectoris: Secondary | ICD-10-CM

## 2017-02-07 LAB — CBC
HCT: 28.5 % — ABNORMAL LOW (ref 39.0–52.0)
HEMOGLOBIN: 8.9 g/dL — AB (ref 13.0–17.0)
MCH: 24.3 pg — AB (ref 26.0–34.0)
MCHC: 31.2 g/dL (ref 30.0–36.0)
MCV: 77.7 fL — AB (ref 78.0–100.0)
PLATELETS: 325 10*3/uL (ref 150–400)
RBC: 3.67 MIL/uL — ABNORMAL LOW (ref 4.22–5.81)
RDW: 15.5 % (ref 11.5–15.5)
WBC: 6.8 10*3/uL (ref 4.0–10.5)

## 2017-02-07 LAB — BASIC METABOLIC PANEL
Anion gap: 8 (ref 5–15)
BUN: 8 mg/dL (ref 6–20)
CALCIUM: 9.3 mg/dL (ref 8.9–10.3)
CHLORIDE: 106 mmol/L (ref 101–111)
CO2: 21 mmol/L — AB (ref 22–32)
CREATININE: 0.86 mg/dL (ref 0.61–1.24)
GFR calc Af Amer: >60 mL/min (ref >60)
GFR calc non Af Amer: >60 mL/min (ref >60)
GLUCOSE: 112 mg/dL — AB (ref 65–99)
Potassium: 4.4 mmol/L (ref 3.5–5.1)
Sodium: 135 mmol/L (ref 135–145)

## 2017-02-07 LAB — ABO/RH: ABO/RH(D): O POS

## 2017-02-07 MED ORDER — VITAMIN C 500 MG PO TABS
250.0000 mg | ORAL_TABLET | Freq: Three times a day (TID) | ORAL | Status: DC
  Administered 2017-02-07 – 2017-02-12 (×13): 250 mg via ORAL
  Filled 2017-02-07 (×13): qty 1

## 2017-02-07 MED ORDER — FERROUS SULFATE 325 (65 FE) MG PO TABS
325.0000 mg | ORAL_TABLET | Freq: Three times a day (TID) | ORAL | Status: DC
  Administered 2017-02-07: 325 mg via ORAL
  Filled 2017-02-07: qty 1

## 2017-02-07 MED ORDER — SODIUM CHLORIDE 0.9 % IV SOLN
510.0000 mg | Freq: Once | INTRAVENOUS | Status: AC
  Administered 2017-02-08: 510 mg via INTRAVENOUS
  Filled 2017-02-07: qty 17

## 2017-02-07 NOTE — Consult Note (Addendum)
Consultation  Referring Provider: Dr. Doylene Canard     Primary Care Physician:  Dixie Dials, MD Primary Gastroenterologist: Althia Forts        Reason for Consultation:  Iron Deficincy Anemia            HPI:   Bryan Roy is a 71 y.o. AA male with a past medical history as listed below including coronary artery disease and recent stent of his LAD 09/2016 maintained on Brilinta and aspirin, who presented to the ER on 02/06/17 with a complaint of shortness of breath and was found to have a hemoglobin of 6.7.  We were consulted in regards to patient's low hemoglobin.    Today, the patient is found laying in bed with his sister by his bedside. He explains that he was feeling increasingly short of breath with activity and felt so bad on the way into work yesterday that he stopped at the ER. Other than this symptoms he described feeling "ok".  He denies any GI complaints.    Medical history significant for no prior colonoscopy.    Patient denies fever, chills, weight loss, anorexia, nausea, vomiting, heartburn, reflux, change in bowel habits, abdominal pain or use of NSAIDs.  Past GI history: None    Past Medical History:  Diagnosis Date  . Coronary artery disease   . DM (diabetes mellitus) ([place])     Past Surgical History:  Procedure Laterality Date  . APPENDECTOMY    . CARDIAC CATHETERIZATION    . CORONARY ANGIOPLASTY    . CORONARY STENT INTERVENTION N/A 10/18/2016   Procedure: CORONARY STENT INTERVENTION;  Surgeon: Dixie Dials, MD;  Location: [place] [place] [facility];  Service: Cardiovascular;  Laterality: N/A;  . LEFT HEART CATH AND CORONARY ANGIOGRAPHY N/A 10/18/2016   Procedure: LEFT HEART CATH AND CORONARY ANGIOGRAPHY;  Surgeon: Dixie Dials, MD;  Location: [place] [facility];  Service: Cardiovascular;  Laterality: N/A;    Family History: No family history of colon cancer or polyps  Social History   Tobacco Use  . Smoking status: Former Research scientist (life sciences)  . Smokeless tobacco: Former  Network engineer Use Topics  . Alcohol use: No  . Drug use: No    Prior to Admission medications   Medication Sig Start Date End Date Taking? Authorizing Provider  amLODipine (NORVASC) 5 MG tablet Take 1 tablet (5 mg total) by mouth daily. Patient taking differently: Take 5 mg by mouth 2 (two) times daily.  10/19/16  Yes Dixie Dials, MD  aspirin EC 81 MG EC tablet Take 1 tablet (81 mg total) by mouth daily. 10/19/16  Yes Dixie Dials, MD  atorvastatin (LIPITOR) 40 MG tablet Take 1 tablet (40 mg total) by mouth daily at 6 PM. 10/19/16  Yes Dixie Dials, MD  hydrALAZINE (APRESOLINE) 25 MG tablet Take 25 mg by mouth 2 (two) times daily.   Yes [provider]  losartan (COZAAR) 100 MG tablet Take 100 mg by mouth daily.   Yes [provider]  metFORMIN (GLUCOPHAGE) 500 MG tablet Take 1 tablet (500 mg total) by mouth 2 (two) times daily with a meal. Start after 2 days. Patient taking differently: Take 500 mg by mouth daily with breakfast.  10/19/16  Yes Dixie Dials, MD  metoprolol tartrate (LOPRESSOR) 25 MG tablet Take 12.5 mg by mouth 2 (two) times daily.    Yes [provider]  nitroGLYCERIN (NITROSTAT) 0.4 MG SL tablet Place 1 tablet (0.4 mg total) under the tongue every 5 (five) minutes  x 3 doses as needed for chest pain. 10/19/16  Yes Dixie Dials, MD  pantoprazole (PROTONIX) 40 MG tablet Take 40 mg by mouth See admin instructions. Take one tablet (40mg ) by mouth prior to taking a Nitro. 01/23/17  Yes [provider]  ticagrelor (BRILINTA) 90 MG TABS tablet Take 1 tablet (90 mg total) by mouth 2 (two) times daily. 10/19/16  Yes Dixie Dials, MD    Current Facility-Administered Medications  Medication Dose Route Frequency Provider Last Rate Last Dose  . 0.9 %  sodium chloride infusion  250 mL Intravenous PRN Dixie Dials, MD      . 0.9 %  sodium chloride infusion   Intravenous Once Dixie Dials, MD      . amLODipine (NORVASC) tablet 5 mg  5 mg Oral BID  Dixie Dials, MD   5 mg at 02/07/17 0814  . atorvastatin (LIPITOR) tablet 40 mg  40 mg Oral q1800 Dixie Dials, MD   40 mg at 02/06/17 2110  . hydrALAZINE (APRESOLINE) tablet 25 mg  25 mg Oral BID Dixie Dials, MD   25 mg at 02/07/17 0815  . losartan (COZAAR) tablet 100 mg  100 mg Oral Daily Dixie Dials, MD   100 mg at 02/07/17 0815  . metFORMIN (GLUCOPHAGE) tablet 250 mg  250 mg Oral BID WC Dixie Dials, MD   250 mg at 02/07/17 9678  . metoprolol tartrate (LOPRESSOR) tablet 12.5 mg  12.5 mg Oral BID Dixie Dials, MD   12.5 mg at 02/07/17 0815  . nitroGLYCERIN (NITROSTAT) SL tablet 0.4 mg  0.4 mg Sublingual Q5 Min x 3 PRN Dixie Dials, MD      . pantoprazole (PROTONIX) EC tablet 40 mg  40 mg Oral Daily PRN Dixie Dials, MD      . sodium chloride flush (NS) 0.9 % injection 3 mL  3 mL Intravenous Q12H Dixie Dials, MD   3 mL at 02/06/17 2103  . sodium chloride flush (NS) 0.9 % injection 3 mL  3 mL Intravenous PRN Dixie Dials, MD        Allergies as of 02/06/2017  . (No Known Allergies)     Review of Systems:    Constitutional: No weight loss, fever or chills Skin: No rash  Cardiovascular: No chest pain Respiratory: Positive for DOE Gastrointestinal: See HPI and otherwise negative Genitourinary: No dysuria  Neurological: No headache, dizziness or syncope Musculoskeletal: No new muscle or joint pain Hematologic: No bleeding Psychiatric: No history of depression or anxiety   Physical Exam:  Vital signs in last 24 hours: Temp:  [98 F (36.7 C)-99.5 F (37.5 C)] 98.8 F (37.1 C) (12/18 0940) Pulse Rate:  [57-72] 61 (12/18 0940) Resp:  [12-20] 14 (12/18 0940) BP: (108-140)/(45-72) 131/65 (12/18 0940) SpO2:  [95 %-100 %] 100 % (12/18 0940) Weight:  [137 lb 12.8 oz (62.5 kg)-139 lb 12.8 oz (63.4 kg)] 139 lb 12.8 oz (63.4 kg) (12/18 0715) Last BM Date: 02/05/17 General:   Pleasant AA male appears to be in NAD, Well developed, Well nourished, alert and cooperative Head:   Normocephalic and atraumatic. Eyes:   PEERL, EOMI. No icterus. Conjunctiva pink. Ears:  Normal auditory acuity. Neck:  Supple Throat: Oral cavity and pharynx without inflammation, swelling or lesion.  Lungs: Respirations even and unlabored. Lungs clear to auscultation bilaterally.   No wheezes, crackles, or rhonchi.  Heart: Normal S1, S2. No MRG. Regular rate and rhythm. No peripheral edema, cyanosis or pallor.  Abdomen:  Soft, nondistended, nontender. No rebound  or guarding. Normal bowel sounds. No appreciable masses or hepatomegaly. Rectal:  Not performed.  Msk:  Symmetrical without gross deformities.  Extremities:  Without edema, no deformity or joint abnormality.  Neurologic:  Alert and  oriented x4;  grossly normal neurologically.  Skin:   Dry and intact without significant lesions or rashes. Psychiatric:  Demonstrates good judgement and reason without abnormal affect or behaviors.  LAB RESULTS: Recent Labs    02/06/17 1119  WBC 5.8  HGB 6.7*  HCT 21.9*  PLT 377   BMET Recent Labs    02/06/17 1119  NA 139  K 4.7  CL 108  CO2 22  GLUCOSE 115*  BUN 7  CREATININE 0.86  CALCIUM 9.6   LFT Recent Labs    02/06/17 1119  PROT 7.3  ALBUMIN 3.7  AST 21  ALT 30  ALKPHOS 103  BILITOT 0.6   PT/INR Recent Labs    02/06/17 1119  LABPROT 14.3  INR 1.11    STUDIES: X-ray Chest Pa And Lateral  Result Date: 02/06/2017 CLINICAL DATA:  Shortness of breath EXAM: CHEST  2 VIEW COMPARISON:  09/16/2011 FINDINGS: Heart size upper normal. No consolidation or effusion. Aortic atherosclerosis. No pneumothorax. IMPRESSION: No active cardiopulmonary disease. Electronically Signed   By: Donavan Foil M.D.   On: 02/06/2017 21:22   PREVIOUS ENDOSCOPIES:            See HPI   Impression / Plan:   Impression: 1. Symptomatic IDA: DOE at time of presentation, hgb found to be 6.7 (compared to 11.8 10/19/16), hemoccult pending, patient denies hematochezia, melena, on chronic  anticoagulation with Brillinta and ASA after stent in August 2018; Consider GI source of blood loss vs other 2. Chronic Anticoagulation: With Brillinta and ASA after stent placement 09/2016-currently on hold, last dose 02/06/17 am 3. CAD s/p PTCA and DES Plan: 1. Continue supportive measures 2. Continue to monitor hgb with transfusion as needed <8 3. Hold anti-coagulants 4. Continue PPI 5. Will discuss possible EGD + Colonoscopy with Dr. Carlean Purl for further eval. Likely will need to wait until Bryan Roy is out of system (5days). Could consider outpatient workup, will leave decision to Dr. Carlean Purl. 6. Please await any further recommendations from Dr. Carlean Purl later today.  Thank you for your kind consultation, we will continue to follow.  Bryan Roy  02/07/2017, 10:39 AM Pager #: [phone]     [facility] Attending   I have taken an interval history, reviewed the chart and examined the patient. I agree with the Advanced Practitioner's note, impression and recommendations.   I have ordered feraheme injection and stopped iron tablets (for now) May resume tablets at dc  I am ok with him continuing 81 mg ASA daily  Will try to do an EGD Thursday - would be optimal for him to be off [place] 5-7 d before a colonoscopy as bleeding risk is greater with therapeutic colonoscopy (polyp removal, etc) but if needed we could do on that medication but chance of needing repeat colonoscoppy soon to remove large polyps, treat AVM's etc  If an EGD explains it all could stop there  He wants to get the colonoscopy done whi,e here and outpatient evaluation may take some time to coordinate  Will finalize plans tomorrow  I appreciate the opportunity to care for you. Bryan Mayer, MD, [facility] [phone] (pager) 02/07/2017 7:21 PM

## 2017-02-07 NOTE — Consult Note (Signed)
   [facility] CM Inpatient Consult   02/07/2017  Bryan Roy 1945/07/14 [id number]   Met with the patient at the bedside.  Patient confirms he still has his [facility] and the patient confirms that his primary care provider is Dr. Dixie Dials. Patient states, "He is my primary doctor  and I don't see any others.  He my heart doctor too,"  This physician is not in the [employer].    His Primary Care Provider is not a [facility] primary care provider or is not [facility] affiliated.   This patient is Not eligible for [facility] Care Management Services.     [name], RN BSN [facility] Liaison  [phone] business mobile phone Toll free office [phone]

## 2017-02-07 NOTE — Progress Notes (Signed)
Ref: Bryan Dials, MD   Subjective:  Feeling little better post 2 units of PRBC transfusion. Very low iron levels. Hemoglobin up to 8.9 g. He is off Brilinta x 1 day.  Objective:  Vital Signs in the last 24 hours: Temp:  [98 F (36.7 C)-99.5 F (37.5 C)] 98 F (36.7 C) (12/18 1216) Pulse Rate:  [56-72] 56 (12/18 1216) Cardiac Rhythm: Normal sinus rhythm (12/18 0740) Resp:  [12-20] 16 (12/18 1216) BP: (108-135)/(45-72) 129/67 (12/18 1216) SpO2:  [100 %] 100 % (12/18 1216) Weight:  [63.4 kg (139 lb 12.8 oz)] 63.4 kg (139 lb 12.8 oz) (12/18 0715)  Physical Exam: BP Readings from Last 1 Encounters:  02/07/17 129/67     Wt Readings from Last 1 Encounters:  02/07/17 63.4 kg (139 lb 12.8 oz)    Weight change:  Body mass index is 24 kg/m. HEENT: [place]/AT, Eyes-Brown, PERL, EOMI, Conjunctiva-Pale, Sclera-Non-icteric Neck: No JVD, No bruit, Trachea midline. Lungs:  Clear, Bilateral. Cardiac:  Regular rhythm, normal S1 and S2, no S3. II/VI systolic murmur. Abdomen:  Soft, non-tender. BS present. Extremities:  No edema present. No cyanosis. No clubbing. CNS: AxOx3, Cranial nerves grossly intact, moves all 4 extremities.  Skin: Warm and dry.   Intake/Output from previous day: 12/17 0701 - 12/18 0700 In: 646.7 [P.O.:240; Blood:406.7] Out: -     Lab Results: BMET    Component Value Date/Time   NA 135 02/07/2017 1040   NA 139 02/06/2017 1119   NA 138 10/19/2016 0352   K 4.4 02/07/2017 1040   K 4.7 02/06/2017 1119   K 4.2 10/19/2016 0352   CL 106 02/07/2017 1040   CL 108 02/06/2017 1119   CL 106 10/19/2016 0352   CO2 21 (L) 02/07/2017 1040   CO2 22 02/06/2017 1119   CO2 24 10/19/2016 0352   GLUCOSE 112 (H) 02/07/2017 1040   GLUCOSE 115 (H) 02/06/2017 1119   GLUCOSE 107 (H) 10/19/2016 0352   BUN 8 02/07/2017 1040   BUN 7 02/06/2017 1119   BUN 9 10/19/2016 0352   CREATININE 0.86 02/07/2017 1040   CREATININE 0.86 02/06/2017 1119   CREATININE 0.89 10/19/2016 0352   CALCIUM 9.3 02/07/2017 1040   CALCIUM 9.6 02/06/2017 1119   CALCIUM 9.4 10/19/2016 0352   GFRNONAA >60 02/07/2017 1040   GFRNONAA >60 02/06/2017 1119   GFRNONAA >60 10/19/2016 0352   GFRAA >60 02/07/2017 1040   GFRAA >60 02/06/2017 1119   GFRAA >60 10/19/2016 0352   CBC    Component Value Date/Time   WBC 6.8 02/07/2017 1040   RBC 3.67 (L) 02/07/2017 1040   HGB 8.9 (L) 02/07/2017 1040   HCT 28.5 (L) 02/07/2017 1040   PLT 325 02/07/2017 1040   MCV 77.7 (L) 02/07/2017 1040   MCH 24.3 (L) 02/07/2017 1040   MCHC 31.2 02/07/2017 1040   RDW 15.5 02/07/2017 1040   LYMPHSABS 1.2 02/06/2017 1119   MONOABS 0.4 02/06/2017 1119   EOSABS 0.1 02/06/2017 1119   BASOSABS 0.1 02/06/2017 1119   HEPATIC Function Panel Recent Labs    10/17/16 1623 02/06/17 1119  PROT 7.3 7.3   HEMOGLOBIN A1C No components found for: HGA1C,  MPG CARDIAC ENZYMES Lab Results  Component Value Date   TROPONINI <0.03 10/18/2016   TROPONINI <0.03 10/17/2016   TROPONINI <0.03 10/17/2016   BNP No results for input(s): PROBNP in the last 8760 hours. TSH No results for input(s): TSH in the last 8760 hours. CHOLESTEROL Recent Labs    10/18/16 0400  CHOL 153    Scheduled Meds: . amLODipine  5 mg Oral BID  . atorvastatin  40 mg Oral q1800  . ferrous sulfate  325 mg Oral TID WC  . hydrALAZINE  25 mg Oral BID  . losartan  100 mg Oral Daily  . metFORMIN  250 mg Oral BID WC  . metoprolol tartrate  12.5 mg Oral BID  . sodium chloride flush  3 mL Intravenous Q12H  . vitamin C  250 mg Oral TID   Continuous Infusions: . sodium chloride    . sodium chloride     PRN Meds:.sodium chloride, nitroGLYCERIN, pantoprazole, sodium chloride flush  Assessment/Plan: Symptomatic ID anemia DM, II Hypertension CAD S/P stent in LAD  Appreciate GI consult. Start iron supplement.   LOS: 1 day    Bryan Dials  MD  02/07/2017, 1:12 PM

## 2017-02-08 LAB — TYPE AND SCREEN
ABO/RH(D): O POS
Antibody Screen: NEGATIVE
Unit division: 0
Unit division: 0

## 2017-02-08 LAB — CBC
HEMATOCRIT: 28.2 % — AB (ref 39.0–52.0)
HEMOGLOBIN: 8.9 g/dL — AB (ref 13.0–17.0)
MCH: 24.5 pg — ABNORMAL LOW (ref 26.0–34.0)
MCHC: 31.6 g/dL (ref 30.0–36.0)
MCV: 77.5 fL — AB (ref 78.0–100.0)
Platelets: 330 10*3/uL (ref 150–400)
RBC: 3.64 MIL/uL — ABNORMAL LOW (ref 4.22–5.81)
RDW: 15.6 % — ABNORMAL HIGH (ref 11.5–15.5)
WBC: 6.7 10*3/uL (ref 4.0–10.5)

## 2017-02-08 LAB — BPAM RBC
BLOOD PRODUCT EXPIRATION DATE: 201901092359
Blood Product Expiration Date: 201901092359
ISSUE DATE / TIME: 201812180646
ISSUE DATE / TIME: 201812180302
UNIT TYPE AND RH: 5100
Unit Type and Rh: 5100

## 2017-02-08 MED ORDER — PANTOPRAZOLE SODIUM 40 MG PO TBEC
40.0000 mg | DELAYED_RELEASE_TABLET | Freq: Every day | ORAL | Status: DC
  Administered 2017-02-08 – 2017-02-12 (×5): 40 mg via ORAL
  Filled 2017-02-08 (×5): qty 1

## 2017-02-08 NOTE — H&P (View-Only) (Signed)
          Daily Rounding Note  02/08/2017, 10:39 AM  LOS: 2 days   SUBJECTIVE:   Chief complaint:  Feels well.  Resolved weakness and fatigue.     No BMs for a few days.    OBJECTIVE:         Vital signs in last 24 hours:    Temp:  [98 F (36.7 C)-98.6 F (37 C)] 98 F (36.7 C) (12/19 0536) Pulse Rate:  [56-67] 63 (12/19 0941) Resp:  [16-18] 18 (12/19 0536) BP: (113-134)/(49-75) 134/75 (12/19 0938) SpO2:  [97 %-100 %] 98 % (12/19 0536) Weight:  [59.9 kg (132 lb)] 59.9 kg (132 lb) (12/19 0536) Last BM Date: 02/07/17 Filed Weights   02/06/17 1042 02/07/17 0715 02/08/17 0536  Weight: 62.5 kg (137 lb 12.8 oz) 63.4 kg (139 lb 12.8 oz) 59.9 kg (132 lb)   General: NAD   Heart: RRR Chest: clear bil.  No dyspnea or cough Abdomen: soft, NT, ND.  Active BS  Extremities: no CCE Neuro/Psych:  Pleasant, calm, cooperative, fully alert and oriented.    Lab Results: Recent Labs    02/06/17 1119 02/07/17 1040 02/08/17 0431  WBC 5.8 6.8 6.7  HGB 6.7* 8.9* 8.9*  HCT 21.9* 28.5* 28.2*  PLT 377 325 330    *   Iron def anemia, in pt on ASA, Brilinta.  FOB status not established.    Got 2 U PRBC 12/18 and Feraheme this AM.  Hgb 6.7 >> 8.9.    *   CAD.  S/p DES 09/2016.  ASA/brilinta on hold.  Last dose 12/17    PLAN   *  EGD tomorrow at 9:45.  Npo after midnight.  Pt agreeable.    *  Switch to scheduled Protonix in place of PRN (has not received any so far)     Azucena Freed  02/08/2017, 10:39 AM Pager: [place] Attending   I have taken an interval history, reviewed the chart and examined the patient. I agree with the Advanced Practitioner's note, impression and recommendations.   Stable  Will start w/ an EGD - if negative needs a colonoscopy - will revisit timing of that after EGD  Gatha Mayer, MD, [facility] [phone] (pager) 02/08/2017 6:38 PM

## 2017-02-08 NOTE — Plan of Care (Signed)
Pt. States he was able to ambulate in the hallway today without incident.

## 2017-02-08 NOTE — Progress Notes (Signed)
          Daily Rounding Note  02/08/2017, 10:39 AM  LOS: 2 days   SUBJECTIVE:   Chief complaint:  Feels well.  Resolved weakness and fatigue.     No BMs for a few days.    OBJECTIVE:         Vital signs in last 24 hours:    Temp:  [98 F (36.7 C)-98.6 F (37 C)] 98 F (36.7 C) (12/19 0536) Pulse Rate:  [56-67] 63 (12/19 0941) Resp:  [16-18] 18 (12/19 0536) BP: (113-134)/(49-75) 134/75 (12/19 0938) SpO2:  [97 %-100 %] 98 % (12/19 0536) Weight:  [59.9 kg (132 lb)] 59.9 kg (132 lb) (12/19 0536) Last BM Date: 02/07/17 Filed Weights   02/06/17 1042 02/07/17 0715 02/08/17 0536  Weight: 62.5 kg (137 lb 12.8 oz) 63.4 kg (139 lb 12.8 oz) 59.9 kg (132 lb)   General: NAD   Heart: RRR Chest: clear bil.  No dyspnea or cough Abdomen: soft, NT, ND.  Active BS  Extremities: no CCE Neuro/Psych:  Pleasant, calm, cooperative, fully alert and oriented.    Lab Results: Recent Labs    02/06/17 1119 02/07/17 1040 02/08/17 0431  WBC 5.8 6.8 6.7  HGB 6.7* 8.9* 8.9*  HCT 21.9* 28.5* 28.2*  PLT 377 325 330    *   Iron def anemia, in pt on ASA, Brilinta.  FOB status not established.    Got 2 U PRBC 12/18 and Feraheme this AM.  Hgb 6.7 >> 8.9.    *   CAD.  S/p DES 09/2016.  ASA/brilinta on hold.  Last dose 12/17    PLAN   *  EGD tomorrow at 9:45.  Npo after midnight.  Pt agreeable.    *  Switch to scheduled Protonix in place of PRN (has not received any so far)     Azucena Freed  02/08/2017, 10:39 AM Pager: [place] Attending   I have taken an interval history, reviewed the chart and examined the patient. I agree with the Advanced Practitioner's note, impression and recommendations.   Stable  Will start w/ an EGD - if negative needs a colonoscopy - will revisit timing of that after EGD  Gatha Mayer, MD, [facility] [phone] (pager) 02/08/2017 6:38 PM

## 2017-02-08 NOTE — Progress Notes (Signed)
Ref: Dixie Dials, MD   Subjective:  No new complaints. Not ambulating much. VS stable.  Objective:  Vital Signs in the last 24 hours: Temp:  [98 F (36.7 C)-98.8 F (37.1 C)] 98 F (36.7 C) (12/19 0536) Pulse Rate:  [56-67] 58 (12/19 0536) Cardiac Rhythm: Sinus bradycardia (12/19 0700) Resp:  [14-18] 18 (12/19 0536) BP: (113-131)/(49-73) 113/49 (12/19 0536) SpO2:  [97 %-100 %] 98 % (12/19 0536) Weight:  [59.9 kg (132 lb)] 59.9 kg (132 lb) (12/19 0536)  Physical Exam: BP Readings from Last 1 Encounters:  02/08/17 (!) 113/49     Wt Readings from Last 1 Encounters:  02/08/17 59.9 kg (132 lb)    Weight change: 0.907 kg (2 lb) Body mass index is 22.66 kg/m. HEENT: [place]/AT, Eyes-Brown, PERL, EOMI, Conjunctiva-Pale pink, Sclera-Non-icteric Neck: No JVD, No bruit, Trachea midline. Lungs:  Clear, Bilateral. Cardiac:  Regular rhythm, normal S1 and S2, no S3. II/VI systolic murmur. Abdomen:  Soft, non-tender. BS present. Extremities:  No edema present. No cyanosis. No clubbing. CNS: AxOx3, Cranial nerves grossly intact, moves all 4 extremities.  Skin: Warm and dry.   Intake/Output from previous day: 12/18 0701 - 12/19 0700 In: 800 [P.O.:800] Out: 1270 [Urine:1270]    Lab Results: BMET    Component Value Date/Time   NA 135 02/07/2017 1040   NA 139 02/06/2017 1119   NA 138 10/19/2016 0352   K 4.4 02/07/2017 1040   K 4.7 02/06/2017 1119   K 4.2 10/19/2016 0352   CL 106 02/07/2017 1040   CL 108 02/06/2017 1119   CL 106 10/19/2016 0352   CO2 21 (L) 02/07/2017 1040   CO2 22 02/06/2017 1119   CO2 24 10/19/2016 0352   GLUCOSE 112 (H) 02/07/2017 1040   GLUCOSE 115 (H) 02/06/2017 1119   GLUCOSE 107 (H) 10/19/2016 0352   BUN 8 02/07/2017 1040   BUN 7 02/06/2017 1119   BUN 9 10/19/2016 0352   CREATININE 0.86 02/07/2017 1040   CREATININE 0.86 02/06/2017 1119   CREATININE 0.89 10/19/2016 0352   CALCIUM 9.3 02/07/2017 1040   CALCIUM 9.6 02/06/2017 1119   CALCIUM 9.4  10/19/2016 0352   GFRNONAA >60 02/07/2017 1040   GFRNONAA >60 02/06/2017 1119   GFRNONAA >60 10/19/2016 0352   GFRAA >60 02/07/2017 1040   GFRAA >60 02/06/2017 1119   GFRAA >60 10/19/2016 0352   CBC    Component Value Date/Time   WBC 6.7 02/08/2017 0431   RBC 3.64 (L) 02/08/2017 0431   HGB 8.9 (L) 02/08/2017 0431   HCT 28.2 (L) 02/08/2017 0431   PLT 330 02/08/2017 0431   MCV 77.5 (L) 02/08/2017 0431   MCH 24.5 (L) 02/08/2017 0431   MCHC 31.6 02/08/2017 0431   RDW 15.6 (H) 02/08/2017 0431   LYMPHSABS 1.2 02/06/2017 1119   MONOABS 0.4 02/06/2017 1119   EOSABS 0.1 02/06/2017 1119   BASOSABS 0.1 02/06/2017 1119   HEPATIC Function Panel Recent Labs    10/17/16 1623 02/06/17 1119  PROT 7.3 7.3   HEMOGLOBIN A1C No components found for: HGA1C,  MPG CARDIAC ENZYMES Lab Results  Component Value Date   TROPONINI <0.03 10/18/2016   TROPONINI <0.03 10/17/2016   TROPONINI <0.03 10/17/2016   BNP No results for input(s): PROBNP in the last 8760 hours. TSH No results for input(s): TSH in the last 8760 hours. CHOLESTEROL Recent Labs    10/18/16 0400  CHOL 153    Scheduled Meds: . amLODipine  5 mg Oral BID  .  atorvastatin  40 mg Oral q1800  . hydrALAZINE  25 mg Oral BID  . losartan  100 mg Oral Daily  . metFORMIN  250 mg Oral BID WC  . metoprolol tartrate  12.5 mg Oral BID  . sodium chloride flush  3 mL Intravenous Q12H  . vitamin C  250 mg Oral TID   Continuous Infusions: . sodium chloride    . sodium chloride     PRN Meds:.sodium chloride, nitroGLYCERIN, pantoprazole, sodium chloride flush  Assessment/Plan: Symptomatic iron deficiency anemia Type II DM Hypertension CAD S/P stent in LAD  Increase activity. EGD tomorrow. Appreciate GI consult.   LOS: 2 days    Dixie Dials  MD  02/08/2017, 8:46 AM

## 2017-02-09 ENCOUNTER — Encounter (HOSPITAL_COMMUNITY)
Admission: AD | Disposition: A | Discharge status: Home / Self Care | Payer: Self-pay | Source: Ambulatory Visit | Attending: Cardiovascular Disease

## 2017-02-09 ENCOUNTER — Encounter (HOSPITAL_COMMUNITY): Payer: Self-pay | Admitting: *Deleted

## 2017-02-09 ENCOUNTER — Inpatient Hospital Stay (HOSPITAL_COMMUNITY): Payer: Medicare HMO | Admitting: Certified Registered Nurse Anesthetist

## 2017-02-09 DIAGNOSIS — K2960 Other gastritis without bleeding: Secondary | ICD-10-CM | POA: Diagnosis present

## 2017-02-09 DIAGNOSIS — D509 Iron deficiency anemia, unspecified: Secondary | ICD-10-CM | POA: Diagnosis present

## 2017-02-09 DIAGNOSIS — K3189 Other diseases of stomach and duodenum: Secondary | ICD-10-CM

## 2017-02-09 DIAGNOSIS — K2100 Gastro-esophageal reflux disease with esophagitis, without bleeding: Secondary | ICD-10-CM | POA: Diagnosis present

## 2017-02-09 HISTORY — PX: ESOPHAGOGASTRODUODENOSCOPY: SHX5428

## 2017-02-09 SURGERY — EGD (ESOPHAGOGASTRODUODENOSCOPY)
Anesthesia: Monitor Anesthesia Care

## 2017-02-09 MED ORDER — PROPOFOL 10 MG/ML IV BOLUS
INTRAVENOUS | Status: DC | PRN
  Administered 2017-02-09: 30 mg via INTRAVENOUS
  Administered 2017-02-09: 20 mg via INTRAVENOUS

## 2017-02-09 MED ORDER — BISACODYL 5 MG PO TBEC
20.0000 mg | DELAYED_RELEASE_TABLET | Freq: Once | ORAL | Status: AC
  Administered 2017-02-09: 20 mg via ORAL
  Filled 2017-02-09: qty 4

## 2017-02-09 MED ORDER — LACTATED RINGERS IV SOLN
INTRAVENOUS | Status: DC
  Administered 2017-02-09: 13:00:00 via INTRAVENOUS

## 2017-02-09 MED ORDER — PROPOFOL 500 MG/50ML IV EMUL
INTRAVENOUS | Status: DC | PRN
  Administered 2017-02-09: 75 ug/kg/min via INTRAVENOUS

## 2017-02-09 MED ORDER — PEG 3350-KCL-NA BICARB-NACL 420 G PO SOLR
2000.0000 mL | Freq: Once | ORAL | Status: AC
  Administered 2017-02-10: 2000 mL via ORAL
  Filled 2017-02-09 (×2): qty 4000

## 2017-02-09 MED ORDER — LACTATED RINGERS IV SOLN
INTRAVENOUS | Status: DC | PRN
  Administered 2017-02-09: 10:00:00 via INTRAVENOUS

## 2017-02-09 MED ORDER — METOCLOPRAMIDE HCL 5 MG/ML IJ SOLN
10.0000 mg | Freq: Once | INTRAMUSCULAR | Status: AC
Start: 2017-02-09 — End: 2017-02-09
  Administered 2017-02-09: 10 mg via INTRAVENOUS
  Filled 2017-02-09: qty 2

## 2017-02-09 MED ORDER — METOCLOPRAMIDE HCL 5 MG/ML IJ SOLN
10.0000 mg | Freq: Once | INTRAMUSCULAR | Status: AC
  Administered 2017-02-09: 10 mg via INTRAVENOUS
  Filled 2017-02-09: qty 2

## 2017-02-09 MED ORDER — LIDOCAINE 2% (20 MG/ML) 5 ML SYRINGE
INTRAMUSCULAR | Status: DC | PRN
  Administered 2017-02-09: 20 mg via INTRAVENOUS

## 2017-02-09 MED ORDER — PEG 3350-KCL-NA BICARB-NACL 420 G PO SOLR
2000.0000 mL | Freq: Once | ORAL | Status: AC
  Administered 2017-02-09: 2000 mL via ORAL

## 2017-02-09 MED ORDER — METFORMIN HCL 500 MG PO TABS
250.0000 mg | ORAL_TABLET | Freq: Two times a day (BID) | ORAL | Status: DC
  Administered 2017-02-09 – 2017-02-12 (×5): 250 mg via ORAL
  Filled 2017-02-09 (×6): qty 1

## 2017-02-09 NOTE — Anesthesia Preprocedure Evaluation (Addendum)
Anesthesia Evaluation  Patient identified by MRN, date of birth, ID band Patient awake    Reviewed: Allergy & Precautions, NPO status , Patient's Chart, lab work & pertinent test results, reviewed documented beta blocker date and time   Airway Mallampati: II  TM Distance: >3 FB Neck ROM: Full    Dental no notable dental hx. (+) Edentulous Upper, Missing   Pulmonary neg pulmonary ROS, former smoker,    Pulmonary exam normal breath sounds clear to auscultation       Cardiovascular hypertension, Pt. on medications and Pt. on home beta blockers + CAD  negative cardio ROS Normal cardiovascular exam Rhythm:Regular Rate:Normal     Neuro/Psych negative neurological ROS  negative psych ROS   GI/Hepatic negative GI ROS, Neg liver ROS,   Endo/Other  negative endocrine ROSdiabetes, Type 2, Oral Hypoglycemic Agents  Renal/GU negative Renal ROS  negative genitourinary   Musculoskeletal negative musculoskeletal ROS (+)   Abdominal   Peds negative pediatric ROS (+)  Hematology negative hematology ROS (+) anemia ,   Anesthesia Other Findings   Reproductive/Obstetrics negative OB ROS                            Anesthesia Physical Anesthesia Plan  ASA: III  Anesthesia Plan: MAC   Post-op Pain Management:    Induction: Intravenous  PONV Risk Score and Plan: 1 and Ondansetron and Treatment may vary due to age or medical condition  Airway Management Planned: Nasal Cannula  Additional Equipment:   Intra-op Plan:   Post-operative Plan:   Informed Consent: I have reviewed the patients History and Physical, chart, labs and discussed the procedure including the risks, benefits and alternatives for the proposed anesthesia with the patient or authorized representative who has indicated his/her understanding and acceptance.   Dental advisory given  Plan Discussed with: CRNA  Anesthesia Plan  Comments:         Anesthesia Quick Evaluation

## 2017-02-09 NOTE — Anesthesia Postprocedure Evaluation (Signed)
Anesthesia Post Note  Patient: RUFINO STAUP  Procedure(s) Performed: ESOPHAGOGASTRODUODENOSCOPY (EGD) (N/A )     Patient location during evaluation: PACU Anesthesia Type: MAC Level of consciousness: awake and alert Pain management: pain level controlled Vital Signs Assessment: post-procedure vital signs reviewed and stable Respiratory status: spontaneous breathing, nonlabored ventilation and respiratory function stable Cardiovascular status: stable and blood pressure returned to baseline Postop Assessment: no apparent nausea or vomiting Anesthetic complications: no    Last Vitals:  Vitals:   02/09/17 1017 02/09/17 1020  BP: 92/64 (!) 112/59  Pulse: 71 65  Resp: 16 18  Temp: 36.8 C   SpO2: 98% 97%    Last Pain:  Vitals:   02/09/17 1017  TempSrc: Oral  PainSc:                  Lynda Rainwater

## 2017-02-09 NOTE — Interval H&P Note (Signed)
History and Physical Interval Note:  02/09/2017 9:37 AM  Bryan Roy  has presented today for surgery, with the diagnosis of iron def anemia.  The various methods of treatment have been discussed with the patient and family. After consideration of risks, benefits and other options for treatment, the patient has consented to  Procedure(s): ESOPHAGOGASTRODUODENOSCOPY (EGD) (N/A) as a surgical intervention .  The patient's history has been reviewed, patient examined, no change in status, stable for surgery.  I have reviewed the patient's chart and labs.  Questions were answered to the patient's satisfaction.     Silvano Rusk

## 2017-02-09 NOTE — Anesthesia Procedure Notes (Signed)
Procedure Name: MAC Date/Time: 02/09/2017 9:57 AM Performed by: Colin Benton, CRNA Pre-anesthesia Checklist: Patient identified, Emergency Drugs available, Suction available and Patient being monitored Patient Re-evaluated:Patient Re-evaluated prior to induction Oxygen Delivery Method: Nasal cannula Induction Type: IV induction Placement Confirmation: positive ETCO2

## 2017-02-09 NOTE — Transfer of Care (Signed)
Immediate Anesthesia Transfer of Care Note  Patient: Bryan Roy  Procedure(s) Performed: ESOPHAGOGASTRODUODENOSCOPY (EGD) (N/A )  Patient Location: Endoscopy Unit  Anesthesia Type:MAC  Level of Consciousness: awake, alert , oriented and patient cooperative  Airway & Oxygen Therapy: Patient Spontanous Breathing and Patient connected to nasal cannula oxygen  Post-op Assessment: Report given to RN and Post -op Vital signs reviewed and stable  Post vital signs: Reviewed and stable  Last Vitals:  Vitals:   02/09/17 0629 02/09/17 0908  BP: 111/64 (!) 153/79  Pulse: 60 66  Resp: 18 10  Temp: 36.7 C 36.8 C  SpO2: 99% 98%    Last Pain:  Vitals:   02/09/17 0908  TempSrc: Oral  PainSc:          Complications: No apparent anesthesia complications

## 2017-02-09 NOTE — Op Note (Signed)
[facility] Patient Name: Bryan Roy Procedure Date : 02/09/2017 MRN: [id number] Attending MD: Gatha Mayer , MD Date of Birth: 1945-12-14 CSN: [id number] Age: 71 Admit Type: Inpatient Procedure:                Upper GI endoscopy Indications:              Iron deficiency anemia Providers:                Gatha Mayer, MD, Angus Seller, Tinnie Gens,                            Technician, Pearline Cables, CRNA Referring MD:              Medicines:                Propofol per Anesthesia, Monitored Anesthesia Care Complications:            No immediate complications. Estimated Blood Loss:     Estimated blood loss was minimal. Procedure:                Pre-Anesthesia Assessment:                           - Prior to the procedure, a History and Physical                            was performed, and patient medications and                            allergies were reviewed. The patient's tolerance of                            previous anesthesia was also reviewed. The risks                            and benefits of the procedure and the sedation                            options and risks were discussed with the patient.                            All questions were answered, and informed consent                            was obtained. Prior Anticoagulants: The patient                            last took antiplatelet medication 3 days prior to                            the procedure. ASA Grade Assessment: III - A                            patient with severe systemic disease. After  reviewing the risks and benefits, the patient was                            deemed in satisfactory condition to undergo the                            procedure.                           After obtaining informed consent, the endoscope was                            passed under direct vision. Throughout the                            procedure, the  patient's blood pressure, pulse, and                            oxygen saturations were monitored continuously. The                            [id number] ([id number]) scope was introduced through the                            mouth, and advanced to the second part of duodenum.                            The upper GI endoscopy was accomplished without                            difficulty. The patient tolerated the procedure                            well. Scope In: Scope Out: Findings:      LA Grade A (one or more mucosal breaks less than 5 mm, not extending       between tops of 2 mucosal folds) esophagitis with no bleeding was found       in the distal esophagus.      A few localized, diminutive non-bleeding erosions were found in the       prepyloric region of the stomach. There were no stigmata of recent       bleeding. Biopsies were taken with a cold forceps for Helicobacter       pylori testing using CLOtest. Verification of patient identification for       the specimen was done. Estimated blood loss was minimal.      The exam was otherwise without abnormality.      The cardia and gastric fundus were normal on retroflexion. Impression:               - LA Grade A reflux esophagitis.                           - Non-bleeding erosive gastropathy. Biopsied.  Probably from ASA                           - The examination was otherwise normal.                           This does not explain iron deficiency anemia Moderate Sedation:      Please see anesthesia notes, moderate sedation not given Recommendation:           - Return patient to hospital ward for ongoing care.                           - Clear liquid diet.                           - Continue present medications.                           - Await pathology results.                           - Perform a colonoscopy tomorrow. Brillinta will be                            off 4-5 days - reviewed increased  bleeding risk w/                            patient - he wishes to proceed - can use clips if                            needed after a polypectomy, etc                           - Use Protonix (pantoprazole) 40 mg PO daily.                           - Continue to hold Brillinta + ASA Procedure Code(s):        --- Professional ---                           [phone], Esophagogastroduodenoscopy, flexible,                            transoral; with biopsy, single or multiple Diagnosis Code(s):        --- Professional ---                           K21.0, Gastro-esophageal reflux disease with                            esophagitis                           K31.89, Other diseases of stomach and duodenum  D50.9, Iron deficiency anemia, unspecified CPT copyright 2016 American Medical Association. All rights reserved. The codes documented in this report are preliminary and upon coder review may  be revised to meet current compliance requirements. Gatha Mayer, MD 02/09/2017 10:18:13 AM This report has been signed electronically. Number of Addenda: 0

## 2017-02-10 ENCOUNTER — Encounter (HOSPITAL_COMMUNITY): Payer: Self-pay | Admitting: *Deleted

## 2017-02-10 ENCOUNTER — Inpatient Hospital Stay (HOSPITAL_COMMUNITY): Payer: Medicare HMO

## 2017-02-10 ENCOUNTER — Inpatient Hospital Stay (HOSPITAL_COMMUNITY): Payer: Medicare HMO | Admitting: Certified Registered Nurse Anesthetist

## 2017-02-10 ENCOUNTER — Encounter (HOSPITAL_COMMUNITY)
Admission: AD | Disposition: A | Discharge status: Home / Self Care | Payer: Self-pay | Source: Ambulatory Visit | Attending: Cardiovascular Disease

## 2017-02-10 DIAGNOSIS — K5730 Diverticulosis of large intestine without perforation or abscess without bleeding: Secondary | ICD-10-CM

## 2017-02-10 DIAGNOSIS — K648 Other hemorrhoids: Secondary | ICD-10-CM

## 2017-02-10 DIAGNOSIS — D490 Neoplasm of unspecified behavior of digestive system: Secondary | ICD-10-CM

## 2017-02-10 HISTORY — PX: COLONOSCOPY WITH PROPOFOL: SHX5780

## 2017-02-10 LAB — CLOTEST (H. PYLORI), BIOPSY: Helicobacter screen: NEGATIVE

## 2017-02-10 SURGERY — COLONOSCOPY WITH PROPOFOL
Anesthesia: Monitor Anesthesia Care

## 2017-02-10 MED ORDER — BARIUM SULFATE 2.1 % PO SUSP
ORAL | Status: AC
  Filled 2017-02-10: qty 2

## 2017-02-10 MED ORDER — MIDAZOLAM HCL 2 MG/2ML IJ SOLN: 0.5000 mg | Freq: Once | INTRAMUSCULAR | Status: DC | PRN

## 2017-02-10 MED ORDER — PROMETHAZINE HCL 25 MG/ML IJ SOLN: 6.2500 mg | INTRAMUSCULAR | Status: DC | PRN

## 2017-02-10 MED ORDER — IOPAMIDOL (ISOVUE-300) INJECTION 61%
INTRAVENOUS | Status: AC
  Administered 2017-02-10: 100 mL
  Filled 2017-02-10: qty 100

## 2017-02-10 MED ORDER — PHENYLEPHRINE 40 MCG/ML (10ML) SYRINGE FOR IV PUSH (FOR BLOOD PRESSURE SUPPORT)
PREFILLED_SYRINGE | INTRAVENOUS | Status: DC | PRN
  Administered 2017-02-10 (×2): 80 ug via INTRAVENOUS

## 2017-02-10 MED ORDER — LIDOCAINE HCL (CARDIAC) 20 MG/ML IV SOLN
INTRAVENOUS | Status: DC | PRN
  Administered 2017-02-10: 40 mg via INTRAVENOUS

## 2017-02-10 MED ORDER — ASPIRIN 81 MG PO CHEW
81.0000 mg | CHEWABLE_TABLET | Freq: Every day | ORAL | Status: DC
  Administered 2017-02-10 – 2017-02-12 (×3): 81 mg via ORAL
  Filled 2017-02-10 (×3): qty 1

## 2017-02-10 MED ORDER — PROPOFOL 500 MG/50ML IV EMUL
INTRAVENOUS | Status: DC | PRN
  Administered 2017-02-10: 75 ug/kg/min via INTRAVENOUS

## 2017-02-10 MED ORDER — PROPOFOL 10 MG/ML IV BOLUS
INTRAVENOUS | Status: DC | PRN
  Administered 2017-02-10 (×2): 20 mg via INTRAVENOUS

## 2017-02-10 MED ORDER — MEPERIDINE HCL 100 MG/ML IJ SOLN: 6.2500 mg | INTRAMUSCULAR | Status: DC | PRN

## 2017-02-10 SURGICAL SUPPLY — 21 items

## 2017-02-10 NOTE — Anesthesia Preprocedure Evaluation (Addendum)
Anesthesia Evaluation  Patient identified by MRN, date of birth, ID band Patient awake    Reviewed: Allergy & Precautions, NPO status , Patient's Chart, lab work & pertinent test results, reviewed documented beta blocker date and time   History of Anesthesia Complications Negative for: history of anesthetic complications  Airway Mallampati: II  TM Distance: >3 FB Neck ROM: Full    Dental  (+) Edentulous Upper, Dental Advisory Given, Poor Dentition, Missing, Chipped   Pulmonary former smoker,    breath sounds clear to auscultation       Cardiovascular hypertension, Pt. on home beta blockers and Pt. on medications + CAD and + Cardiac Stents   Rhythm:Regular Rate:Normal     Neuro/Psych negative neurological ROS     GI/Hepatic GERD  Medicated and Controlled,  Endo/Other  diabetes, Oral Hypoglycemic Agents  Renal/GU      Musculoskeletal   Abdominal   Peds  Hematology  (+) Blood dyscrasia (Brilinta), , Hb 8.9   Anesthesia Other Findings   Reproductive/Obstetrics                            Anesthesia Physical Anesthesia Plan  ASA: III  Anesthesia Plan: MAC   Post-op Pain Management:    Induction:   PONV Risk Score and Plan: 1 and Ondansetron and Treatment may vary due to age or medical condition  Airway Management Planned: Natural Airway and Simple Face Mask  Additional Equipment:   Intra-op Plan:   Post-operative Plan:   Informed Consent: I have reviewed the patients History and Physical, chart, labs and discussed the procedure including the risks, benefits and alternatives for the proposed anesthesia with the patient or authorized representative who has indicated his/her understanding and acceptance.   Dental advisory given  Plan Discussed with: CRNA and Surgeon  Anesthesia Plan Comments: (Plan routine monitors, MAC)        Anesthesia Quick Evaluation

## 2017-02-10 NOTE — Op Note (Signed)
[facility] Patient Name: Bryan Roy Procedure Date : 02/10/2017 MRN: [id number] Attending MD: Gatha Mayer , MD Date of Birth: 22-Aug-1945 CSN: [id number] Age: 71 Admit Type: Inpatient Procedure:                Colonoscopy Indications:              Iron deficiency anemia Providers:                Gatha Mayer, MD, Cleda Daub, RN, William Dalton, Technician Referring MD:              Medicines:                Propofol per Anesthesia, Monitored Anesthesia Care Complications:            No immediate complications. Estimated Blood Loss:     Estimated blood loss was minimal. Procedure:                Pre-Anesthesia Assessment:                           - Prior to the procedure, a History and Physical                            was performed, and patient medications and                            allergies were reviewed. The patient's tolerance of                            previous anesthesia was also reviewed. The risks                            and benefits of the procedure and the sedation                            options and risks were discussed with the patient.                            All questions were answered, and informed consent                            was obtained. Prior Anticoagulants: The patient                            last took aspirin 4 days and antiplatelet                            medication 4 days prior to the procedure. ASA Grade                            Assessment: III - A patient with severe systemic  disease. After reviewing the risks and benefits,                            the patient was deemed in satisfactory condition to                            undergo the procedure.                           After obtaining informed consent, the colonoscope                            was passed under direct vision. Throughout the                            procedure, the  patient's blood pressure, pulse, and                            oxygen saturations were monitored continuously. The                            EC-[id number] ([id number]) scope was introduced through                            the anus and advanced to the the terminal ileum,                            with identification of the appendiceal orifice and                            IC valve. The colonoscopy was performed without                            difficulty. The patient tolerated the procedure                            well. The quality of the bowel preparation was                            good. The bowel preparation used was MoviPrep. The                            terminal ileum, ileocecal valve, appendiceal                            orifice, and rectum were photographed. Scope In: 10:15:55 AM Scope Out: 10:36:06 AM Scope Withdrawal Time: 0 hours 12 minutes 40 seconds  Total Procedure Duration: 0 hours 20 minutes 11 seconds  Findings:      The terminal ileum appeared normal.      A medium-sized polypoid lesion was found appendiceal orifice. The lesion       was submucosal. No bleeding was present. This was biopsied with a cold       forceps for histology. Verification of patient identification for the  specimen was done. Estimated blood loss was minimal.      Multiple diverticula were found in the entire colon.      Internal hemorrhoids were found during retroflexion.      The exam was otherwise without abnormality on direct and retroflexion       views. Impression:               - The examined portion of the ileum was normal.                           - Likely benign polypoid lesion at the appendiceal                            orifice. Biopsied.                           - Diverticulosis in the entire examined colon.                           - Internal hemorrhoids.                           - The examination was otherwise normal on direct                            and  retroflexion views. Moderate Sedation:      Please see anesthesia notes, moderate sedation not given Recommendation:           - Patient has a contact number available for                            emergencies. The signs and symptoms of potential                            delayed complications were discussed with the                            patient. Return to normal activities tomorrow.                            Written discharge instructions were provided to the                            patient.                           - Clear liquid diet.                           - Continue present medications.                           - Resume aspirin at prior dose today.                           - Await pathology results.                           -  He has had an appendectomy and maybe just post op                            deformity but ? if he has a mucocele - probably not                            but worth CT scan I think and I ordered it                           Once have that result back and if ok will restart                            Brilinta                           Will likely arrange outpatiuent capsule endoscopy                            pending pathology review                           Back on oral iron at dc                           I will f/u after CT resulted - can advance diet                            after that unless we were to find something that                            needed surgery (doubt)                           - Repeat colonoscopy may be recommended. The                            colonoscopy date will be determined after pathology                            results from today's exam become available for                            review. Procedure Code(s):        --- Professional ---                           [id number], Colonoscopy, flexible; with biopsy, single                            or multiple Diagnosis Code(s):        --- Professional ---                            K64.8, Other hemorrhoids  D49.0, Neoplasm of unspecified behavior of                            digestive system                           D50.9, Iron deficiency anemia, unspecified                           K57.30, Diverticulosis of large intestine without                            perforation or abscess without bleeding CPT copyright 2016 American Medical Association. All rights reserved. The codes documented in this report are preliminary and upon coder review may  be revised to meet current compliance requirements. Gatha Mayer, MD 02/10/2017 10:56:36 AM This report has been signed electronically. Number of Addenda: 0

## 2017-02-10 NOTE — Care Management Important Message (Signed)
Important Message  Patient Details  Name: Bryan Roy MRN: [id number] Date of Birth: [birth year]   Medicare Important Message Given:  Yes    Orbie Pyo 02/10/2017, 1:37 PM

## 2017-02-10 NOTE — Plan of Care (Signed)
  Education: Knowledge of General Education information will improve 02/10/2017 1740 - Progressing by Rolm Baptise, RN 02/10/2017 1346 - Progressing by Rolm Baptise, RN   Clinical Measurements: Cardiovascular complication will be avoided 02/10/2017 1740 - Progressing by Rolm Baptise, RN   Activity: Risk for activity intolerance will decrease 02/10/2017 1740 - Progressing by Rolm Baptise, RN   Nutrition: Adequate nutrition will be maintained 02/10/2017 1740 - Progressing by Rolm Baptise, RN 02/10/2017 1346 - Progressing by Rolm Baptise, RN Note NPO for procedure and ct scan IV fluid running KVO.   Nutrition: Adequate nutrition will be maintained 02/10/2017 1740 - Progressing by Rolm Baptise, RN 02/10/2017 1346 - Progressing by Rolm Baptise, RN Note NPO for procedure and ct scan IV fluid running KVO.   Elimination: Will not experience complications related to bowel motility 02/10/2017 1346 - Progressing by Rolm Baptise, RN   Elimination: Will not experience complications related to bowel motility 02/10/2017 1346 - Progressing by Rolm Baptise, RN

## 2017-02-10 NOTE — Progress Notes (Signed)
Patient is alert and oriented has completed one bottle of barium when in 14 mins. Plan to drink second bottle at 1430 and scan after 1500.

## 2017-02-10 NOTE — Transfer of Care (Signed)
Immediate Anesthesia Transfer of Care Note  Patient: Bryan Roy  Procedure(s) Performed: COLONOSCOPY WITH PROPOFOL (N/A )  Patient Location: Endoscopy Unit  Anesthesia Type:MAC  Level of Consciousness: drowsy and patient cooperative  Airway & Oxygen Therapy: Patient Spontanous Breathing and Patient connected to nasal cannula oxygen  Post-op Assessment: Report given to RN, Post -op Vital signs reviewed and stable and Patient moving all extremities X 4  Post vital signs: Reviewed and stable  Last Vitals:  Vitals:   02/10/17 0626 02/10/17 0933  BP: 125/63 (!) 168/68  Pulse: (!) 58 68  Resp: 18 10  Temp: (!) 36.4 C 36.8 C  SpO2: 95% 99%    Last Pain:  Vitals:   02/10/17 0933  TempSrc: Oral  PainSc:          Complications: No apparent anesthesia complications

## 2017-02-10 NOTE — Progress Notes (Signed)
Ref: Dixie Dials, MD   Subjective:  Feeling better. EGD with mild esophagitis. Colonoscopy without source of hemorrhage.   Objective:  Vital Signs in the last 24 hours: Temp:  [97.5 F (36.4 C)-98.3 F (36.8 C)] 98.2 F (36.8 C) (12/21 2051) Pulse Rate:  [55-117] 67 (12/21 2051) Cardiac Rhythm: Normal sinus rhythm (12/21 1900) Resp:  [10-18] 18 (12/21 2051) BP: (107-168)/(50-80) 122/65 (12/21 2051) SpO2:  [95 %-100 %] 100 % (12/21 2051) Weight:  [63.1 kg (139 lb 3.2 oz)] 63.1 kg (139 lb 3.2 oz) (12/21 1950)  Physical Exam: BP Readings from Last 1 Encounters:  02/10/17 122/65     Wt Readings from Last 1 Encounters:  02/10/17 63.1 kg (139 lb 3.2 oz)    Weight change: -0.181 kg (-6.4 oz) Body mass index is 23.89 kg/m. HEENT: /AT, Eyes-Brown, PERL, EOMI, Conjunctiva-Pale pink, Sclera-Non-icteric Neck: No JVD, No bruit, Trachea midline. Lungs:  Clear, Bilateral. Cardiac:  Regular rhythm, normal S1 and S2, no S3. II/VI systolic murmur. Abdomen:  Soft, non-tender. BS present. Extremities:  No edema present. No cyanosis. No clubbing. CNS: AxOx3, Cranial nerves grossly intact, moves all 4 extremities.  Skin: Warm and dry.   Intake/Output from previous day: 12/20 0701 - 12/21 0700 In: 4860 [P.O.:4480; I.V.:380] Out: 470 [Urine:470]    Lab Results: BMET    Component Value Date/Time   NA 135 02/07/2017 1040   NA 139 02/06/2017 1119   NA 138 10/19/2016 0352   K 4.4 02/07/2017 1040   K 4.7 02/06/2017 1119   K 4.2 10/19/2016 0352   CL 106 02/07/2017 1040   CL 108 02/06/2017 1119   CL 106 10/19/2016 0352   CO2 21 (L) 02/07/2017 1040   CO2 22 02/06/2017 1119   CO2 24 10/19/2016 0352   GLUCOSE 112 (H) 02/07/2017 1040   GLUCOSE 115 (H) 02/06/2017 1119   GLUCOSE 107 (H) 10/19/2016 0352   BUN 8 02/07/2017 1040   BUN 7 02/06/2017 1119   BUN 9 10/19/2016 0352   CREATININE 0.86 02/07/2017 1040   CREATININE 0.86 02/06/2017 1119   CREATININE 0.89 10/19/2016 0352   CALCIUM 9.3 02/07/2017 1040   CALCIUM 9.6 02/06/2017 1119   CALCIUM 9.4 10/19/2016 0352   GFRNONAA >60 02/07/2017 1040   GFRNONAA >60 02/06/2017 1119   GFRNONAA >60 10/19/2016 0352   GFRAA >60 02/07/2017 1040   GFRAA >60 02/06/2017 1119   GFRAA >60 10/19/2016 0352   CBC    Component Value Date/Time   WBC 6.7 02/08/2017 0431   RBC 3.64 (L) 02/08/2017 0431   HGB 8.9 (L) 02/08/2017 0431   HCT 28.2 (L) 02/08/2017 0431   PLT 330 02/08/2017 0431   MCV 77.5 (L) 02/08/2017 0431   MCH 24.5 (L) 02/08/2017 0431   MCHC 31.6 02/08/2017 0431   RDW 15.6 (H) 02/08/2017 0431   LYMPHSABS 1.2 02/06/2017 1119   MONOABS 0.4 02/06/2017 1119   EOSABS 0.1 02/06/2017 1119   BASOSABS 0.1 02/06/2017 1119   HEPATIC Function Panel Recent Labs    10/17/16 1623 02/06/17 1119  PROT 7.3 7.3   HEMOGLOBIN A1C No components found for: HGA1C,  MPG CARDIAC ENZYMES Lab Results  Component Value Date   TROPONINI <0.03 10/18/2016   TROPONINI <0.03 10/17/2016   TROPONINI <0.03 10/17/2016   BNP No results for input(s): PROBNP in the last 8760 hours. TSH No results for input(s): TSH in the last 8760 hours. CHOLESTEROL Recent Labs    10/18/16 0400  CHOL 153    Scheduled  Meds: . amLODipine  5 mg Oral BID  . aspirin  81 mg Oral Daily  . atorvastatin  40 mg Oral q1800  . Barium Sulfate      . hydrALAZINE  25 mg Oral BID  . losartan  100 mg Oral Daily  . metFORMIN  250 mg Oral BID WC  . metoprolol tartrate  12.5 mg Oral BID  . pantoprazole  40 mg Oral Daily  . sodium chloride flush  3 mL Intravenous Q12H  . vitamin C  250 mg Oral TID   Continuous Infusions: . sodium chloride    . sodium chloride     PRN Meds:.sodium chloride, nitroGLYCERIN, sodium chloride flush  Assessment/Plan: Symptomatic iron deficiency anemia Type II DM Hypertension CAD S/P stent in LAD  Increase activity. Home soon.   LOS: 4 days    Dixie Dials  MD  02/10/2017, 10:08 PM

## 2017-02-10 NOTE — Plan of Care (Signed)
  Education: Knowledge of General Education information will improve 02/10/2017 1346 - Progressing by Rolm Baptise, RN   Nutrition: Adequate nutrition will be maintained 02/10/2017 1346 - Progressing by Rolm Baptise, RN Note NPO for procedure and ct scan IV fluid running KVO.   Elimination: Will not experience complications related to bowel motility 02/10/2017 1346 - Progressing by Rolm Baptise, RN   Pain Managment: General experience of comfort will improve 02/10/2017 1346 - Progressing by Rolm Baptise, RN   Safety: Ability to remain free from injury will improve 02/10/2017 1346 - Progressing by Rolm Baptise, RN   Skin Integrity: Risk for impaired skin integrity will decrease 02/10/2017 1346 - Progressing by Rolm Baptise, RN

## 2017-02-10 NOTE — Progress Notes (Signed)
Pt. had blood tinged BM of liquid/watery consistency. Provider informed, with orders to continue ordered bowel prep drink and IV Reglan. RN will continue to monitor.

## 2017-02-10 NOTE — Progress Notes (Signed)
Received report from [place] in Therapist, sports. Pt stable to return to unit.

## 2017-02-10 NOTE — Progress Notes (Signed)
Pt has finished 2nd barium swallow.

## 2017-02-10 NOTE — Anesthesia Postprocedure Evaluation (Signed)
Anesthesia Post Note  Patient: Bryan Roy  Procedure(s) Performed: COLONOSCOPY WITH PROPOFOL (N/A )     Patient location during evaluation: Endoscopy Anesthesia Type: MAC Level of consciousness: oriented, awake and alert and patient cooperative Pain management: pain level controlled Vital Signs Assessment: post-procedure vital signs reviewed and stable Respiratory status: spontaneous breathing, nonlabored ventilation and respiratory function stable Cardiovascular status: blood pressure returned to baseline and stable Postop Assessment: no apparent nausea or vomiting Anesthetic complications: no    Last Vitals:  Vitals:   02/10/17 1044 02/10/17 1050  BP: (!) 107/55 (!) 111/50  Pulse: 72 64  Resp: 17 13  Temp: 36.8 C   SpO2: 99% 99%    Last Pain:  Vitals:   02/10/17 1044  TempSrc: Oral  PainSc:                  Shalonda Sachse,E. Rylie Limburg

## 2017-02-10 NOTE — Interval H&P Note (Signed)
History and Physical Interval Note:  02/10/2017 10:07 AM  Bryan Roy  has presented today for surgery, with the diagnosis of iron deficiency anemia  The various methods of treatment have been discussed with the patient and family. After consideration of risks, benefits and other options for treatment, the patient has consented to  Procedure(s): COLONOSCOPY WITH PROPOFOL (N/A) as a surgical intervention .  The patient's history has been reviewed, patient examined, no change in status, stable for surgery.  I have reviewed the patient's chart and labs.  Questions were answered to the patient's satisfaction.     Silvano Rusk

## 2017-02-10 NOTE — Progress Notes (Signed)
Late entry Ref: Dixie Dials, MD   Subjective:  Awaiting EGD. VS and Hgb stable.  Objective:  Vital Signs in the last 24 hours: Temp:  [97.5 F (36.4 C)-98.3 F (36.8 C)] 98.3 F (36.8 C) (12/21 1044) Pulse Rate:  [54-72] 64 (12/21 1050) Cardiac Rhythm: Normal sinus rhythm (12/21 0757) Resp:  [10-18] 13 (12/21 1050) BP: (107-168)/(50-71) 111/50 (12/21 1050) SpO2:  [95 %-100 %] 99 % (12/21 1050) Weight:  [63.1 kg (139 lb 3.2 oz)] 63.1 kg (139 lb 3.2 oz) (12/21 4008)  Physical Exam: BP Readings from Last 1 Encounters:  02/10/17 (!) 111/50     Wt Readings from Last 1 Encounters:  02/10/17 63.1 kg (139 lb 3.2 oz)    Weight change: -0.181 kg (-6.4 oz) Body mass index is 23.89 kg/m. HEENT: [place]/AT, Eyes-Brown, PERL, EOMI, Conjunctiva-Pale pink, Sclera-Non-icteric Neck: No JVD, No bruit, Trachea midline. Lungs:  Clear, Bilateral. Cardiac:  Regular rhythm, normal S1 and S2, no S3. II/VI systolic murmur. Abdomen:  Soft, non-tender. BS present. Extremities:  No edema present. No cyanosis. No clubbing. CNS: AxOx3, Cranial nerves grossly intact, moves all 4 extremities.  Skin: Warm and dry.   Intake/Output from previous day: 12/20 0701 - 12/21 0700 In: 4860 [P.O.:4480; I.V.:380] Out: 470 [Urine:470]    Lab Results: BMET    Component Value Date/Time   NA 135 02/07/2017 1040   NA 139 02/06/2017 1119   NA 138 10/19/2016 0352   K 4.4 02/07/2017 1040   K 4.7 02/06/2017 1119   K 4.2 10/19/2016 0352   CL 106 02/07/2017 1040   CL 108 02/06/2017 1119   CL 106 10/19/2016 0352   CO2 21 (L) 02/07/2017 1040   CO2 22 02/06/2017 1119   CO2 24 10/19/2016 0352   GLUCOSE 112 (H) 02/07/2017 1040   GLUCOSE 115 (H) 02/06/2017 1119   GLUCOSE 107 (H) 10/19/2016 0352   BUN 8 02/07/2017 1040   BUN 7 02/06/2017 1119   BUN 9 10/19/2016 0352   CREATININE 0.86 02/07/2017 1040   CREATININE 0.86 02/06/2017 1119   CREATININE 0.89 10/19/2016 0352   CALCIUM 9.3 02/07/2017 1040   CALCIUM 9.6  02/06/2017 1119   CALCIUM 9.4 10/19/2016 0352   GFRNONAA >60 02/07/2017 1040   GFRNONAA >60 02/06/2017 1119   GFRNONAA >60 10/19/2016 0352   GFRAA >60 02/07/2017 1040   GFRAA >60 02/06/2017 1119   GFRAA >60 10/19/2016 0352   CBC    Component Value Date/Time   WBC 6.7 02/08/2017 0431   RBC 3.64 (L) 02/08/2017 0431   HGB 8.9 (L) 02/08/2017 0431   HCT 28.2 (L) 02/08/2017 0431   PLT 330 02/08/2017 0431   MCV 77.5 (L) 02/08/2017 0431   MCH 24.5 (L) 02/08/2017 0431   MCHC 31.6 02/08/2017 0431   RDW 15.6 (H) 02/08/2017 0431   LYMPHSABS 1.2 02/06/2017 1119   MONOABS 0.4 02/06/2017 1119   EOSABS 0.1 02/06/2017 1119   BASOSABS 0.1 02/06/2017 1119   HEPATIC Function Panel Recent Labs    10/17/16 1623 02/06/17 1119  PROT 7.3 7.3   HEMOGLOBIN A1C No components found for: HGA1C,  MPG CARDIAC ENZYMES Lab Results  Component Value Date   TROPONINI <0.03 10/18/2016   TROPONINI <0.03 10/17/2016   TROPONINI <0.03 10/17/2016   BNP No results for input(s): PROBNP in the last 8760 hours. TSH No results for input(s): TSH in the last 8760 hours. CHOLESTEROL Recent Labs    10/18/16 0400  CHOL 153    Scheduled Meds: . [  MAR Hold] amLODipine  5 mg Oral BID  . [MAR Hold] atorvastatin  40 mg Oral q1800  . [MAR Hold] hydrALAZINE  25 mg Oral BID  . [MAR Hold] losartan  100 mg Oral Daily  . [MAR Hold] metFORMIN  250 mg Oral BID WC  . [MAR Hold] metoprolol tartrate  12.5 mg Oral BID  . [MAR Hold] pantoprazole  40 mg Oral Daily  . [MAR Hold] sodium chloride flush  3 mL Intravenous Q12H  . [MAR Hold] vitamin C  250 mg Oral TID   Continuous Infusions: . [MAR Hold] sodium chloride    . [MAR Hold] sodium chloride    . lactated ringers 20 mL/hr at 02/09/17 1322   PRN Meds:.[MAR Hold] sodium chloride, meperidine (DEMEROL) injection, midazolam, [MAR Hold] nitroGLYCERIN, promethazine, [MAR Hold] sodium chloride flush  Assessment/Plan: Symptomatic anemia Type II DM Hypertension CAD S/P  stent in LAD  EGD soon   LOS: 3 days    Dixie Dials  MD  02/10/2017, 10:56 AM

## 2017-02-11 MED ORDER — CLOPIDOGREL BISULFATE 75 MG PO TABS
75.0000 mg | ORAL_TABLET | Freq: Every day | ORAL | Status: DC
  Administered 2017-02-11 – 2017-02-12 (×2): 75 mg via ORAL
  Filled 2017-02-11 (×2): qty 1

## 2017-02-11 NOTE — Progress Notes (Signed)
   Chart review - patient not seen  CT findings ? Lipoma in cecum, enlarged prostate   I will f/u bxs and make any additional recommendations. May need capsule endo which can do as an outpatient,  He can go home unless there are new issues that would keep him here  - should tale ferrous sulfate 325 mg bid  OK to resume Brilinta also (and ASA)   Gatha Mayer, MD, [facility] [phone] (pager) 02/11/2017 8:44 AM

## 2017-02-11 NOTE — Progress Notes (Signed)
Subjective:  Patient denies any chest pain or shortness of breath. Denies abdominal pain. GI workup so 4 has been negative patient didn't had significant drop in hemoglobin requiring packed RBCs had DES and mid LAD and has occluded RCA. Has not been started on dual antiplatelet yet.  Objective:  Vital Signs in the last 24 hours: Temp:  [98.2 F (36.8 C)-100.8 F (38.2 C)] 100.8 F (38.2 C) (12/22 0544) Pulse Rate:  [55-117] 82 (12/22 0544) Resp:  [16-18] 18 (12/22 0544) BP: (119-157)/(61-80) 119/61 (12/22 0544) SpO2:  [98 %-100 %] 98 % (12/22 0544) Weight:  [62 kg (136 lb 9.6 oz)] 62 kg (136 lb 9.6 oz) (12/22 0544)  Intake/Output from previous day: 12/21 0701 - 12/22 0700 In: 960 [P.O.:760; I.V.:200] Out: 750 [Urine:750] Intake/Output from this shift: Total I/O In: 243 [P.O.:240; I.V.:3] Out: -   Physical Exam: Neck: no adenopathy, no carotid bruit, no JVD and supple, symmetrical, trachea midline Lungs: clear to auscultation bilaterally Heart: regular rate and rhythm, S1, S2 normal and 2/6 systolic murmur noted. Abdomen: soft, non-tender; bowel sounds normal; no masses,  no organomegaly Extremities: extremities normal, atraumatic, no cyanosis or edema  Lab Results: No results for input(s): WBC, HGB, PLT in the last 72 hours. No results for input(s): NA, K, CL, CO2, GLUCOSE, BUN, CREATININE in the last 72 hours. No results for input(s): TROPONINI in the last 72 hours.  Invalid input(s): CK, MB Hepatic Function Panel No results for input(s): PROT, ALBUMIN, AST, ALT, ALKPHOS, BILITOT, BILIDIR, IBILI in the last 72 hours. No results for input(s): CHOL in the last 72 hours. No results for input(s): PROTIME in the last 72 hours.  Imaging: Imaging results have been reviewed and Ct Abdomen Pelvis W Contrast  Result Date: 02/10/2017 CLINICAL DATA:  Submucosal polypoid lesion along the appendiceal orifice at colonoscopy EXAM: CT ABDOMEN AND PELVIS WITH CONTRAST TECHNIQUE:  Multidetector CT imaging of the abdomen and pelvis was performed using the standard protocol following bolus administration of intravenous contrast. CONTRAST:  130mL ISOVUE-300 IOPAMIDOL (ISOVUE-300) INJECTION 61% COMPARISON:  08/19/2014 FINDINGS: Lower chest: Left anterior descending coronary artery atherosclerotic calcification is suspected. Hepatobiliary: Numerous gallstones fill most of the gallbladder, and are generally at or under 5 mm in diameter. No liver mass identified. Pancreas: Pancreas divisum Spleen: Unremarkable Adrenals/Urinary Tract: Non rotated kidneys. Large volume urinary bladder. Minimal fullness of the adrenal glands without discrete nodule. Stomach/Bowel: Terminal ileum unremarkable. There is considerable diverticulosis involving the cecum and ascending colon. On image 46/7 and also seen on image 38/ 3 there is a polypoid lesion in the cecum measuring approximately 1.5 by 1.2 by 1.3 cm with fatty and internal stranding soft tissue elements. Partially due to the diverticula Descending and sigmoid colon diverticulosis is also noted. It is difficult to locate the appendix which presumably is balled up along the cecal margin. Somewhat sharp turn at the anorectal junction noted with small caliber of the rectum at the anorectal junction, some of this appearance may be from internal hemorrhoids Vascular/Lymphatic: Aortoiliac atherosclerotic vascular disease. Reproductive: The prostate gland measures 6.2 by 4.9 by 5.8 cm (volume = 92 cm^3) and there is nodular prominence of the median lobe indenting the bladder base. Mildly asymmetric contour of the left posterior margin of the seminal vesicles with a slightly higher than expected density of the seminal vesicles. Other: No supplemental non-categorized findings. Musculoskeletal: Lumbar spondylosis and degenerative disc disease causing foraminal impingement on the left at L5-S 1 and on the right at L4-5. IMPRESSION: 1.  Somewhat fibrofatty polyp along  the cecum, reportedly submucosal position, potentially a lipoma, leiomyoma, or angiolipofibroma. Clearly correlation with the pathology results from biopsy will be warranted. No liver mass or surrounding adenopathy. 2. Enlarged prostate gland with some asymmetry and high density of the seminal vesicles. Correlate with PSA level in determining whether further workup for the possibility of prostate cancer is warranted. 3. Other imaging findings of potential clinical significance: Cholelithiasis. Coronary atherosclerosis. Aortic Atherosclerosis (ICD10-I70.0). Pancreas divisum. Lumbar spondylosis and degenerative disc disease causing lower lumbar impingement. Electronically Signed   By: Van Clines M.D.   On: 02/10/2017 18:12    Cardiac Studies:  Assessment/Plan:  Symptomatic iron deficiency anemia Type II DM Hypertension CAD S/P stent in LAD Plan Will switch Brilinta to Plavix Check CBC in a.m. if stable will discharge home tomorrow     LOS: 5 days    Charolette Forward 02/11/2017, 10:57 AM

## 2017-02-11 NOTE — Progress Notes (Signed)
Nutrition Education Note  RD consulted for nutrition education regarding nutrition management for diverticulosis.    RD provided high fiber handout from the [employer]. Discussed foods to avoid. Discussed best practice for long term management of diverticulosis is a high fiber diet and discussed ways to gradually increase fiber in the diet.   Teach back method used. Pt verbalizes understanding of information provided.   Expect good compliance.  Body mass index is Body mass index is 23.45 kg/m.Marland Kitchen Pt meets criteria for normal weight based on current BMI.  Current diet order is heart healthy CHO modified, patient is consuming approximately 75% of meals at this time. Labs and medications reviewed. No further nutrition interventions warranted at this time. RD contact information provided. If additional nutrition issues arise, please re-consult RD.   Molli Barrows, RD, LDN, [place] Pager [phone] After Hours Pager [phone]

## 2017-02-12 ENCOUNTER — Encounter (HOSPITAL_COMMUNITY): Payer: Self-pay | Admitting: Internal Medicine

## 2017-02-12 LAB — CBC
HCT: 28.7 % — ABNORMAL LOW (ref 39.0–52.0)
Hemoglobin: 9 g/dL — ABNORMAL LOW (ref 13.0–17.0)
MCH: 24.9 pg — AB (ref 26.0–34.0)
MCHC: 31.4 g/dL (ref 30.0–36.0)
MCV: 79.5 fL (ref 78.0–100.0)
PLATELETS: 279 10*3/uL (ref 150–400)
RBC: 3.61 MIL/uL — AB (ref 4.22–5.81)
RDW: 17.3 % — AB (ref 11.5–15.5)
WBC: 14.5 10*3/uL — ABNORMAL HIGH (ref 4.0–10.5)

## 2017-02-12 LAB — BASIC METABOLIC PANEL
Anion gap: 8 (ref 5–15)
BUN: 13 mg/dL (ref 6–20)
CALCIUM: 9.1 mg/dL (ref 8.9–10.3)
CHLORIDE: 103 mmol/L (ref 101–111)
CO2: 22 mmol/L (ref 22–32)
CREATININE: 1.15 mg/dL (ref 0.61–1.24)
GFR calc non Af Amer: >60 mL/min (ref >60)
GLUCOSE: 119 mg/dL — AB (ref 65–99)
Potassium: 3.9 mmol/L (ref 3.5–5.1)
Sodium: 133 mmol/L — ABNORMAL LOW (ref 135–145)

## 2017-02-12 MED ORDER — CLOPIDOGREL BISULFATE 75 MG PO TABS: 75.0000 mg | ORAL_TABLET | Freq: Every day | ORAL | 3 refills | Status: DC

## 2017-02-12 MED ORDER — FERROUS SULFATE 325 (65 FE) MG PO TBEC: 325.0000 mg | DELAYED_RELEASE_TABLET | Freq: Two times a day (BID) | ORAL | 3 refills | Status: AC

## 2017-02-12 NOTE — Discharge Instructions (Signed)
Gastrointestinal Bleeding °Gastrointestinal bleeding is bleeding somewhere along the path food travels through the body (digestive tract). This path is anywhere between the mouth and the opening of the butt (anus). You may have blood in your poop (stools) or have black poop. If you throw up (vomit), there may be blood in it. °This condition can be mild, serious, or even life-threatening. If you have a lot of bleeding, you may need to stay in the hospital. °Follow these instructions at home: °· Take over-the-counter and prescription medicines only as told by your doctor. °· Eat foods that have a lot of fiber in them. These foods include whole grains, fruits, and vegetables. You can also try eating 1-3 prunes each day. °· Drink enough fluid to keep your pee (urine) clear or pale yellow. °· Keep all follow-up visits as told by your doctor. This is important. °Contact a doctor if: °· Your symptoms do not get better. °Get help right away if: °· Your bleeding gets worse. °· You feel dizzy or you pass out (faint). °· You feel weak. °· You have very bad cramps in your back or belly (abdomen). °· You pass large clumps of blood (clots) in your poop. °· Your symptoms are getting worse. °This information is not intended to replace advice given to you by your health care provider. Make sure you discuss any questions you have with your health care provider. °Document Released: 11/17/2007 Document Revised: 07/16/2015 Document Reviewed: 07/28/2014 °Elsevier Interactive Patient Education © 2018 [employer] ° °

## 2017-02-12 NOTE — Discharge Summary (Signed)
NAME:  Bryan Roy, Bryan Roy NO.:  [id number]  MEDICAL RECORD NO.:  [id number]  LOCATION:                                 FACILITY:  PHYSICIAN:  Ara Grandmaison N. Terrence Dupont, M.D.      DATE OF BIRTH:  DATE OF ADMISSION:  02/06/2017 DATE OF DISCHARGE:  02/12/2017                              DISCHARGE SUMMARY   ADMITTING DIAGNOSES: 1. Shortness of breath. 2. Diabetes mellitus. 3. Hypertension. 4. Coronary artery disease, status post PCI to LAD in the past     approximately 4 months ago.  DISCHARGE DIAGNOSES: 1. Status post symptomatic iron deficiency anemia, status post packed     RBC blood transfusion, status post EGD and colonoscopy with no     obvious source of gastrointestinal bleed. 2. Coronary artery disease, status post PTCA stenting to LAD in August     2018. 3. Diabetes mellitus. 4. Hypertension.  DISCHARGE HOME MEDICATIONS: 1. Clopidogrel 75 mg 1 tablet daily. 2. Aspirin 81 mg daily. 3. Atorvastatin 40 mg 1 tablet daily. 4. Hydralazine 25 mg twice daily. 5. Losartan 100 mg daily. 6. Metoprolol 25 mg half tablet twice daily. 7. Nitrostat sublingual p.r.n. 8. Protonix 40 mg 1 tablet daily. 9. Amlodipine 5 mg daily. 10.Metformin 500 mg twice daily. 11.The patient has been advised to stop Brilinta. 12.Feosol 325 mg 1 tablet 3 times daily.  The patient has been advised to call 911 if he notices any bleeding per rectum or black/tarry stool associated with feeling dizzy, weak, or shortness of breath.  CONDITION AT DISCHARGE:  Stable.  FOLLOWUP:  Follow up with Dr. Doylene Canard in 1 week.  Follow up with GI as scheduled.  The patient will be scheduled for a capsule study as outpatient as appropriate.  The patient also has been advised to avoid NSAIDs.  BRIEF HISTORY AND HOSPITAL COURSE:  Mr. Bryan Roy is a 71 year old male with past medical history significant for coronary artery disease, status post PCI to mid LAD in August 2018, hypertension, type 2  diabetes mellitus, hyperlipidemia, who was admitted by Dr. Doylene Canard because of 1- week history of progressive increasing shortness of breath.  The patient denies any fever, chills, cough, or chest pain.  PHYSICAL EXAMINATION:  GENERAL:  He was alert, awake, oriented x3, hemodynamically stable. HEENT:  Conjunctivae were pale.  Sclerae mildly. NECK:  Supple.  No JVD.  No bruit. LUNGS:  Clear to auscultation bilaterally. CARDIOVASCULAR:  Regular rate and rhythm.  S1, S2 normal.  There was 2/6 systolic murmur. ABDOMEN:  Soft.  Bowel sounds were present.  Nontender. EXTREMITIES:  There was no clubbing, cyanosis, or edema. NEURO:  Grossly intact.  LABORATORY DATA:  His sodium was 139, potassium 4.7, BUN 7, creatinine 0.86.  Hemoglobin was 6.7, hematocrit 21.9, white count of 5.8.  Last hemoglobin today is 9, hematocrit 28.7, white count of 14.5 which has been stable.  Last electrolytes:  Sodium 133, potassium 3.9, BUN 13, creatinine 1.15, glucose is 119.  BRIEF HOSPITAL COURSE:  The patient was admitted to telemetry unit.  GI consultation was obtained as the patient was noted to have marked symptomatic anemia.  The patient subsequently underwent upper endoscopy and colonoscopy as per procedure  report.  The patient did receive 2 units of packed RBCs during the hospital stay.  His hemoglobin has appropriately gone up and without further drop in hemoglobin.  The patient's breathing has markedly improved.  The patient denied any chest pain during the hospital stay.  The patient will be discharged home on above medications and will be followed up by Dr. Doylene Canard next week and GI as scheduled.  The patient will be scheduled for a capsule study as outpatient as appropriate.     Allegra Lai. Terrence Dupont, M.D.     MNH/MEDQ  D:  02/12/2017  T:  02/12/2017  Job:  [id number]

## 2017-02-12 NOTE — Discharge Summary (Signed)
Discharge summary dictated on 02/12/2017 dictation number is [id number]

## 2017-02-16 NOTE — Progress Notes (Signed)
Please let him know that the colon biopsies so not show a problem. He has a lipoma - benign fatty growth.  I do recommend we set him up for a capsule endoscopy exam due to heme + stool and iron-deficiency anemia  Reason is to look for small bowel blood loss  He is a surgical candidate  If it were needed and he does not have gastroparesis or a pacer/AICD

## 2017-03-07 NOTE — Progress Notes (Signed)
OK 

## 2017-03-16 DIAGNOSIS — E1140 Type 2 diabetes mellitus with diabetic neuropathy, unspecified: Secondary | ICD-10-CM | POA: Diagnosis not present

## 2017-03-16 DIAGNOSIS — I2510 Atherosclerotic heart disease of native coronary artery without angina pectoris: Secondary | ICD-10-CM | POA: Diagnosis not present

## 2017-03-16 DIAGNOSIS — I10 Essential (primary) hypertension: Secondary | ICD-10-CM | POA: Diagnosis not present

## 2017-03-16 DIAGNOSIS — Z9861 Coronary angioplasty status: Secondary | ICD-10-CM | POA: Diagnosis not present

## 2017-03-16 DIAGNOSIS — D500 Iron deficiency anemia secondary to blood loss (chronic): Secondary | ICD-10-CM | POA: Diagnosis not present

## 2017-04-13 DIAGNOSIS — D500 Iron deficiency anemia secondary to blood loss (chronic): Secondary | ICD-10-CM | POA: Diagnosis not present

## 2017-04-13 DIAGNOSIS — E1140 Type 2 diabetes mellitus with diabetic neuropathy, unspecified: Secondary | ICD-10-CM | POA: Diagnosis not present

## 2017-04-13 DIAGNOSIS — Z9861 Coronary angioplasty status: Secondary | ICD-10-CM | POA: Diagnosis not present

## 2017-04-13 DIAGNOSIS — I2510 Atherosclerotic heart disease of native coronary artery without angina pectoris: Secondary | ICD-10-CM | POA: Diagnosis not present

## 2017-04-13 DIAGNOSIS — I4589 Other specified conduction disorders: Secondary | ICD-10-CM | POA: Diagnosis not present

## 2017-05-15 DIAGNOSIS — I2510 Atherosclerotic heart disease of native coronary artery without angina pectoris: Secondary | ICD-10-CM | POA: Diagnosis not present

## 2017-05-15 DIAGNOSIS — E1140 Type 2 diabetes mellitus with diabetic neuropathy, unspecified: Secondary | ICD-10-CM | POA: Diagnosis not present

## 2017-05-15 DIAGNOSIS — Z9861 Coronary angioplasty status: Secondary | ICD-10-CM | POA: Diagnosis not present

## 2017-05-15 DIAGNOSIS — I10 Essential (primary) hypertension: Secondary | ICD-10-CM | POA: Diagnosis not present

## 2017-05-15 DIAGNOSIS — M542 Cervicalgia: Secondary | ICD-10-CM | POA: Diagnosis not present

## 2017-06-15 ENCOUNTER — Other Ambulatory Visit: Payer: Self-pay | Admitting: Cardiovascular Disease

## 2017-06-15 ENCOUNTER — Ambulatory Visit
Admission: RE | Admit: 2017-06-15 | Discharge: 2017-06-15 | Disposition: A | Discharge status: Home / Self Care | Payer: Medicare HMO | Source: Ambulatory Visit | Attending: Cardiovascular Disease | Admitting: Cardiovascular Disease

## 2017-06-15 DIAGNOSIS — Z9861 Coronary angioplasty status: Secondary | ICD-10-CM | POA: Diagnosis not present

## 2017-06-15 DIAGNOSIS — M545 Low back pain: Secondary | ICD-10-CM

## 2017-06-15 DIAGNOSIS — I2510 Atherosclerotic heart disease of native coronary artery without angina pectoris: Secondary | ICD-10-CM | POA: Diagnosis not present

## 2017-06-15 DIAGNOSIS — I10 Essential (primary) hypertension: Secondary | ICD-10-CM | POA: Diagnosis not present

## 2017-06-15 DIAGNOSIS — S3992XA Unspecified injury of lower back, initial encounter: Secondary | ICD-10-CM | POA: Diagnosis not present

## 2017-06-15 DIAGNOSIS — E1140 Type 2 diabetes mellitus with diabetic neuropathy, unspecified: Secondary | ICD-10-CM | POA: Diagnosis not present

## 2017-08-15 DIAGNOSIS — I2510 Atherosclerotic heart disease of native coronary artery without angina pectoris: Secondary | ICD-10-CM | POA: Diagnosis not present

## 2017-08-15 DIAGNOSIS — E1140 Type 2 diabetes mellitus with diabetic neuropathy, unspecified: Secondary | ICD-10-CM | POA: Diagnosis not present

## 2017-08-15 DIAGNOSIS — M545 Low back pain: Secondary | ICD-10-CM | POA: Diagnosis not present

## 2017-08-15 DIAGNOSIS — Z9861 Coronary angioplasty status: Secondary | ICD-10-CM | POA: Diagnosis not present

## 2017-08-15 DIAGNOSIS — I10 Essential (primary) hypertension: Secondary | ICD-10-CM | POA: Diagnosis not present

## 2017-09-18 DIAGNOSIS — E1140 Type 2 diabetes mellitus with diabetic neuropathy, unspecified: Secondary | ICD-10-CM | POA: Diagnosis not present

## 2017-09-18 DIAGNOSIS — Z9861 Coronary angioplasty status: Secondary | ICD-10-CM | POA: Diagnosis not present

## 2017-09-18 DIAGNOSIS — I10 Essential (primary) hypertension: Secondary | ICD-10-CM | POA: Diagnosis not present

## 2017-09-18 DIAGNOSIS — M545 Low back pain: Secondary | ICD-10-CM | POA: Diagnosis not present

## 2017-09-18 DIAGNOSIS — I2510 Atherosclerotic heart disease of native coronary artery without angina pectoris: Secondary | ICD-10-CM | POA: Diagnosis not present

## 2017-11-15 DIAGNOSIS — Z9861 Coronary angioplasty status: Secondary | ICD-10-CM | POA: Diagnosis not present

## 2017-11-15 DIAGNOSIS — I2510 Atherosclerotic heart disease of native coronary artery without angina pectoris: Secondary | ICD-10-CM | POA: Diagnosis not present

## 2017-11-15 DIAGNOSIS — I10 Essential (primary) hypertension: Secondary | ICD-10-CM | POA: Diagnosis not present

## 2017-11-15 DIAGNOSIS — E1140 Type 2 diabetes mellitus with diabetic neuropathy, unspecified: Secondary | ICD-10-CM | POA: Diagnosis not present

## 2017-11-15 DIAGNOSIS — M545 Low back pain: Secondary | ICD-10-CM | POA: Diagnosis not present

## 2017-11-29 DIAGNOSIS — E876 Hypokalemia: Secondary | ICD-10-CM | POA: Diagnosis not present

## 2017-11-29 DIAGNOSIS — E785 Hyperlipidemia, unspecified: Secondary | ICD-10-CM | POA: Diagnosis not present

## 2017-11-29 DIAGNOSIS — E559 Vitamin D deficiency, unspecified: Secondary | ICD-10-CM | POA: Diagnosis not present

## 2017-11-29 DIAGNOSIS — D649 Anemia, unspecified: Secondary | ICD-10-CM | POA: Diagnosis not present

## 2017-11-29 DIAGNOSIS — Z79899 Other long term (current) drug therapy: Secondary | ICD-10-CM | POA: Diagnosis not present

## 2017-11-29 DIAGNOSIS — E119 Type 2 diabetes mellitus without complications: Secondary | ICD-10-CM | POA: Diagnosis not present

## 2017-11-29 DIAGNOSIS — N429 Disorder of prostate, unspecified: Secondary | ICD-10-CM | POA: Diagnosis not present

## 2017-12-14 ENCOUNTER — Encounter (HOSPITAL_COMMUNITY): Payer: Self-pay

## 2017-12-14 ENCOUNTER — Inpatient Hospital Stay (HOSPITAL_COMMUNITY)
Admission: EM | Admit: 2017-12-14 | Discharge: 2017-12-18 | DRG: 378 | Disposition: A | Discharge status: Home / Self Care | Payer: Medicare HMO | Attending: Cardiovascular Disease | Admitting: Cardiovascular Disease

## 2017-12-14 ENCOUNTER — Other Ambulatory Visit: Payer: Self-pay

## 2017-12-14 DIAGNOSIS — Z7984 Long term (current) use of oral hypoglycemic drugs: Secondary | ICD-10-CM

## 2017-12-14 DIAGNOSIS — D649 Anemia, unspecified: Secondary | ICD-10-CM | POA: Diagnosis not present

## 2017-12-14 DIAGNOSIS — K625 Hemorrhage of anus and rectum: Secondary | ICD-10-CM

## 2017-12-14 DIAGNOSIS — Z955 Presence of coronary angioplasty implant and graft: Secondary | ICD-10-CM

## 2017-12-14 DIAGNOSIS — K648 Other hemorrhoids: Secondary | ICD-10-CM | POA: Diagnosis present

## 2017-12-14 DIAGNOSIS — I2510 Atherosclerotic heart disease of native coronary artery without angina pectoris: Secondary | ICD-10-CM | POA: Diagnosis not present

## 2017-12-14 DIAGNOSIS — R42 Dizziness and giddiness: Secondary | ICD-10-CM | POA: Diagnosis not present

## 2017-12-14 DIAGNOSIS — E1140 Type 2 diabetes mellitus with diabetic neuropathy, unspecified: Secondary | ICD-10-CM | POA: Diagnosis not present

## 2017-12-14 DIAGNOSIS — I10 Essential (primary) hypertension: Secondary | ICD-10-CM | POA: Diagnosis present

## 2017-12-14 DIAGNOSIS — D62 Acute posthemorrhagic anemia: Secondary | ICD-10-CM | POA: Diagnosis present

## 2017-12-14 DIAGNOSIS — R0902 Hypoxemia: Secondary | ICD-10-CM | POA: Diagnosis not present

## 2017-12-14 DIAGNOSIS — Z8719 Personal history of other diseases of the digestive system: Secondary | ICD-10-CM

## 2017-12-14 DIAGNOSIS — K210 Gastro-esophageal reflux disease with esophagitis: Secondary | ICD-10-CM | POA: Diagnosis not present

## 2017-12-14 DIAGNOSIS — E119 Type 2 diabetes mellitus without complications: Secondary | ICD-10-CM | POA: Diagnosis not present

## 2017-12-14 DIAGNOSIS — R55 Syncope and collapse: Secondary | ICD-10-CM | POA: Diagnosis not present

## 2017-12-14 DIAGNOSIS — K922 Gastrointestinal hemorrhage, unspecified: Secondary | ICD-10-CM | POA: Diagnosis not present

## 2017-12-14 DIAGNOSIS — Z7982 Long term (current) use of aspirin: Secondary | ICD-10-CM | POA: Diagnosis not present

## 2017-12-14 DIAGNOSIS — Z9861 Coronary angioplasty status: Secondary | ICD-10-CM | POA: Diagnosis not present

## 2017-12-14 DIAGNOSIS — Z7902 Long term (current) use of antithrombotics/antiplatelets: Secondary | ICD-10-CM | POA: Diagnosis not present

## 2017-12-14 DIAGNOSIS — Z87891 Personal history of nicotine dependence: Secondary | ICD-10-CM | POA: Diagnosis not present

## 2017-12-14 DIAGNOSIS — K5731 Diverticulosis of large intestine without perforation or abscess with bleeding: Principal | ICD-10-CM | POA: Diagnosis present

## 2017-12-14 DIAGNOSIS — Z79899 Other long term (current) drug therapy: Secondary | ICD-10-CM

## 2017-12-14 DIAGNOSIS — R110 Nausea: Secondary | ICD-10-CM | POA: Diagnosis not present

## 2017-12-14 DIAGNOSIS — D500 Iron deficiency anemia secondary to blood loss (chronic): Secondary | ICD-10-CM | POA: Diagnosis not present

## 2017-12-14 DIAGNOSIS — R1111 Vomiting without nausea: Secondary | ICD-10-CM | POA: Diagnosis not present

## 2017-12-14 LAB — CBC WITH DIFFERENTIAL/PLATELET
Abs Immature Granulocytes: 0.05 10*3/uL (ref 0.00–0.07)
Basophils Absolute: 0.1 10*3/uL (ref 0.0–0.1)
Basophils Relative: 0 %
Eosinophils Absolute: 0 10*3/uL (ref 0.0–0.5)
Eosinophils Relative: 0 %
HCT: 27.3 % — ABNORMAL LOW (ref 39.0–52.0)
Hemoglobin: 8.1 g/dL — ABNORMAL LOW (ref 13.0–17.0)
Immature Granulocytes: 0 %
Lymphocytes Relative: 6 %
Lymphs Abs: 0.9 10*3/uL (ref 0.7–4.0)
MCH: 24.2 pg — ABNORMAL LOW (ref 26.0–34.0)
MCHC: 29.7 g/dL — ABNORMAL LOW (ref 30.0–36.0)
MCV: 81.5 fL (ref 80.0–100.0)
Monocytes Absolute: 0.4 10*3/uL (ref 0.1–1.0)
Monocytes Relative: 3 %
Neutro Abs: 13.5 10*3/uL — ABNORMAL HIGH (ref 1.7–7.7)
Neutrophils Relative %: 91 %
Platelets: 341 10*3/uL (ref 150–400)
RBC: 3.35 MIL/uL — ABNORMAL LOW (ref 4.22–5.81)
RDW: 18.5 % — ABNORMAL HIGH (ref 11.5–15.5)
WBC: 15 10*3/uL — ABNORMAL HIGH (ref 4.0–10.5)
nRBC: 0 % (ref 0.0–0.2)

## 2017-12-14 LAB — BASIC METABOLIC PANEL
Anion gap: 12 (ref 5–15)
BUN: 17 mg/dL (ref 8–23)
CO2: 19 mmol/L — ABNORMAL LOW (ref 22–32)
Calcium: 9.5 mg/dL (ref 8.9–10.3)
Chloride: 110 mmol/L (ref 98–111)
Creatinine, Ser: 1.03 mg/dL (ref 0.61–1.24)
GFR calc Af Amer: >60 mL/min (ref >60)
GFR calc non Af Amer: >60 mL/min (ref >60)
Glucose, Bld: 177 mg/dL — ABNORMAL HIGH (ref 70–99)
Potassium: 4.3 mmol/L (ref 3.5–5.1)
Sodium: 141 mmol/L (ref 135–145)

## 2017-12-14 LAB — I-STAT TROPONIN, ED: Troponin i, poc: 0 ng/mL (ref 0.00–0.08)

## 2017-12-14 LAB — POC OCCULT BLOOD, ED: Fecal Occult Bld: POSITIVE — AB

## 2017-12-14 MED ORDER — LOSARTAN POTASSIUM 50 MG PO TABS
100.0000 mg | ORAL_TABLET | Freq: Every day | ORAL | Status: DC
  Administered 2017-12-15 – 2017-12-18 (×4): 100 mg via ORAL
  Filled 2017-12-14 (×4): qty 2

## 2017-12-14 MED ORDER — FERROUS SULFATE 325 (65 FE) MG PO TABS
325.0000 mg | ORAL_TABLET | Freq: Two times a day (BID) | ORAL | Status: DC
  Administered 2017-12-14 – 2017-12-18 (×8): 325 mg via ORAL
  Filled 2017-12-14 (×9): qty 1

## 2017-12-14 MED ORDER — ATORVASTATIN CALCIUM 40 MG PO TABS
40.0000 mg | ORAL_TABLET | Freq: Every day | ORAL | Status: DC
  Administered 2017-12-14 – 2017-12-17 (×4): 40 mg via ORAL
  Filled 2017-12-14 (×4): qty 1

## 2017-12-14 MED ORDER — PANTOPRAZOLE SODIUM 40 MG PO TBEC
40.0000 mg | DELAYED_RELEASE_TABLET | Freq: Every day | ORAL | Status: DC
  Administered 2017-12-15 – 2017-12-18 (×4): 40 mg via ORAL
  Filled 2017-12-14 (×4): qty 1

## 2017-12-14 MED ORDER — SODIUM CHLORIDE 0.9 % IV SOLN
INTRAVENOUS | Status: DC
  Administered 2017-12-14 – 2017-12-17 (×5): via INTRAVENOUS

## 2017-12-14 MED ORDER — AMLODIPINE BESYLATE 5 MG PO TABS
5.0000 mg | ORAL_TABLET | Freq: Every day | ORAL | Status: DC
  Administered 2017-12-15 – 2017-12-18 (×4): 5 mg via ORAL
  Filled 2017-12-14 (×4): qty 1

## 2017-12-14 MED ORDER — FERROUS SULFATE 325 (65 FE) MG PO TBEC: 325.0000 mg | DELAYED_RELEASE_TABLET | Freq: Two times a day (BID) | ORAL | Status: DC

## 2017-12-14 MED ORDER — NITROGLYCERIN 0.4 MG SL SUBL: 0.4000 mg | SUBLINGUAL_TABLET | SUBLINGUAL | Status: DC | PRN

## 2017-12-14 MED ORDER — METFORMIN HCL 500 MG PO TABS
500.0000 mg | ORAL_TABLET | Freq: Every day | ORAL | Status: DC
  Administered 2017-12-15 – 2017-12-18 (×4): 500 mg via ORAL
  Filled 2017-12-14 (×4): qty 1

## 2017-12-14 MED ORDER — METOPROLOL TARTRATE 12.5 MG HALF TABLET
12.5000 mg | ORAL_TABLET | Freq: Two times a day (BID) | ORAL | Status: DC
  Administered 2017-12-14 – 2017-12-18 (×8): 12.5 mg via ORAL
  Filled 2017-12-14 (×8): qty 1

## 2017-12-14 MED ORDER — HYDRALAZINE HCL 25 MG PO TABS
25.0000 mg | ORAL_TABLET | Freq: Two times a day (BID) | ORAL | Status: DC
  Administered 2017-12-14 – 2017-12-18 (×8): 25 mg via ORAL
  Filled 2017-12-14 (×8): qty 1

## 2017-12-14 NOTE — ED Notes (Signed)
Ordered diet tray for pt  

## 2017-12-14 NOTE — Consult Note (Signed)
[facility] Consult: 1:37 PM 12/14/2017  LOS: 0 days    Referring Provider: Dr Doylene Canard  Primary Care Physician:  Dixie Dials, MD Primary Gastroenterologist:  Dr. Carlean Purl.     Reason for Consultation:  Painless hematochezia.     HPI: Bryan Roy is a 72 y.o. male.  Hx CAD.  Cardiac stent placed 09/2016.  Previously on Brilinta but now on Plavix.   History IDA. 01/2017 colonoscopy.  Examined portion of the ileum normal.  Likely benign polypoid lesion at appendiceal orifice, biopsied.  Pandiverticulosis.  Internal hemorrhoids. Biopsy showed colonic mucosa with lymphoid aggregates and submucosal lipoma, no dysplasia. 01/2017 EGD.  LA grade a reflux esophagitis.  Non-bleeding, erosive gastropathy, probably from aspirin.  CLOtest negative.  Otherwise normal study. Dr. Carlean Purl recommended capsule endoscopy,but it was never performed.  Initially there were problems with the equipment malfunctioning but once it was working the patient's caregiver declined to schedule the study and preferred to follow-up with his primary care provider before proceeding to the capsule endoscopy.   Patient now having hematochezia.  Yesterday he had his normal daily brown stool.  This morning while he was at work he had 2 episodes of painless hematochezia.  He started to get diaphoretic, dizzy, nauseated.  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 Not had any further bowel movements bloody or otherwise since this morning.  His dizziness, diaphoresis, nausea have resolved.  Heart rate in the 70s.  No significant hypotension.  No fever. Hgb 8.1, MCV 81.5.  WBC 16 K. Hgb in 01/2017 was 8.9. Stool FOBT positive. BUN normal.  Patient does not smoke cigarettes or  consume alcoholic beverages.  On daily 81 ASA but no NSAIDs.  Meds also include iron sulfate and Protonix 40 mg daily   Past Medical History:  Diagnosis Date  . Anemia 01/2017  . Coronary artery disease   . DM (diabetes mellitus) ([place])   . Dyspnea     Past Surgical History:  Procedure Laterality Date  . APPENDECTOMY    . CARDIAC CATHETERIZATION    . COLONOSCOPY WITH PROPOFOL N/A 02/10/2017   Procedure: COLONOSCOPY WITH PROPOFOL;  Surgeon: Gatha Mayer, MD;  Location: [facility] ENDOSCOPY;  Service: Endoscopy;  Laterality: N/A;  . CORONARY ANGIOPLASTY    . CORONARY STENT INTERVENTION N/A 10/18/2016   Procedure: CORONARY STENT INTERVENTION;  Surgeon: Dixie Dials, MD;  Location: [place] CV LAB;  Service: Cardiovascular;  Laterality: N/A;  . ESOPHAGOGASTRODUODENOSCOPY N/A 02/09/2017   Procedure: ESOPHAGOGASTRODUODENOSCOPY (EGD);  Surgeon: Gatha Mayer, MD;  Location: [facility] ENDOSCOPY;  Service: Endoscopy;  Laterality: N/A;  . LEFT HEART CATH AND CORONARY ANGIOGRAPHY N/A 10/18/2016   Procedure: LEFT HEART CATH AND CORONARY ANGIOGRAPHY;  Surgeon: Dixie Dials, MD;  Location: [place] CV LAB;  Service: Cardiovascular;  Laterality: N/A;    Prior to Admission medications   Medication Sig Start Date End Date Taking? Authorizing Provider  amLODipine (NORVASC) 5 MG tablet Take 1 tablet (5 mg total) by mouth  daily. 10/19/16  Yes Dixie Dials, MD  atorvastatin (LIPITOR) 40 MG tablet Take 1 tablet (40 mg total) by mouth daily at 6 PM. 10/19/16  Yes Dixie Dials, MD  clopidogrel (PLAVIX) 75 MG tablet Take 1 tablet (75 mg total) by mouth daily. 02/13/17  Yes Charolette Forward, MD  ferrous sulfate 325 (65 FE) MG EC tablet Take 1 tablet (325 mg total) by mouth 2 (two) times daily. 02/12/17 02/12/18 Yes Charolette Forward, MD  hydrALAZINE (APRESOLINE) 25 MG tablet Take 25 mg by mouth 2 (two) times daily.   Yes [provider]  losartan (COZAAR) 100 MG tablet Take 100 mg by mouth daily.   Yes  [provider]  metFORMIN (GLUCOPHAGE) 500 MG tablet Take 1 tablet (500 mg total) by mouth 2 (two) times daily with a meal. Start after 2 days. Patient taking differently: Take 500 mg by mouth daily with breakfast.  10/19/16  Yes Dixie Dials, MD  metoprolol tartrate (LOPRESSOR) 25 MG tablet Take 12.5 mg by mouth 2 (two) times daily.    Yes [provider]  nitroGLYCERIN (NITROSTAT) 0.4 MG SL tablet Place 1 tablet (0.4 mg total) under the tongue every 5 (five) minutes x 3 doses as needed for chest pain. 10/19/16  Yes Dixie Dials, MD  pantoprazole (PROTONIX) 40 MG tablet Take 40 mg by mouth See admin instructions. Take one tablet (40mg ) by mouth prior to taking a Nitro. 01/23/17  Yes [provider]  aspirin EC 81 MG EC tablet Take 1 tablet (81 mg total) by mouth daily. 10/19/16   Dixie Dials, MD    Scheduled Meds: . amLODipine  5 mg Oral Daily  . atorvastatin  40 mg Oral q1800  . ferrous sulfate  325 mg Oral BID  . hydrALAZINE  25 mg Oral BID  . losartan  100 mg Oral Daily  . [START ON 12/15/2017] metFORMIN  500 mg Oral Q breakfast  . metoprolol tartrate  12.5 mg Oral BID  . pantoprazole  40 mg Oral See admin instructions   Infusions: . sodium chloride     PRN Meds: nitroGLYCERIN   Allergies as of 12/14/2017  . (No Known Allergies)    No family history on file.  Social History   Socioeconomic History  . Marital status: Single    Spouse name: Not on file  . Number of children: Not on file  . Years of education: Not on file  . Highest education level: Not on file  Occupational History  . Not on file  Social Needs  . Financial resource strain: Not on file  . Food insecurity:    Worry: Not on file    Inability: Not on file  . Transportation needs:    Medical: Not on file    Non-medical: Not on file  Tobacco Use  . Smoking status: Former Research scientist (life sciences)  . Smokeless tobacco: Former Network engineer and Sexual Activity  . Alcohol use: No  . Drug  use: No  . Sexual activity: Yes  Lifestyle  . Physical activity:    Days per week: Not on file    Minutes per session: Not on file  . Stress: Not on file  Relationships  . Social connections:    Talks on phone: Not on file    Gets together: Not on file    Attends religious service: Not on file    Active member of club or organization: Not on file    Attends meetings of clubs or organizations: Not on  file    Relationship status: Not on file  . Intimate partner violence:    Fear of current or ex partner: Not on file    Emotionally abused: Not on file    Physically abused: Not on file    Forced sexual activity: Not on file  Other Topics Concern  . Not on file  Social History Narrative  . Not on file    REVIEW OF SYSTEMS: Constitutional:  Per HPI ENT:  No nose bleeds Pulm: No shortness of breath, no cough. CV:  No palpitations, no LE edema.  No chest pain. GU:  No hematuria, no frequency GI: Occasional dysphagia which he attributes to not being able to chew that well.  Things that trigger it are grapes or other large vegetables/fruit Heme: Denies unusual or excessive bleeding or bruising. Transfusions: Its use with 2 units PRBCs in 01/2017. Neuro:  No headaches, no peripheral tingling or numbness Derm:  No itching, no rash or sores.  Endocrine:  No sweats or chills.  No polyuria or dysuria Immunization: Did not inquire as to recent flu shots or other vaccinations. Travel:  None beyond local counties in last few months.    PHYSICAL EXAM: Vital signs in last 24 hours: Vitals:   12/14/17 1300 12/14/17 1315  BP: 127/66 123/65  Pulse: 62 60  Resp: 13 13  Temp:    SpO2: 100% 100%   Wt Readings from Last 3 Encounters:  02/12/17 61.8 kg  10/19/16 65.5 kg  09/16/11 90.7 kg    General: Pleasant, comfortable, alert.  Does not look acutely ill or unstable. Head: No facial asymmetry or swelling.  No signs of head trauma. Eyes: No scleral icterus.  No conjunctival  pallor. Ears: Not hard of hearing Nose: Discharge Mouth: Oral mucosa moist, pink, clear.  All but a few of his bottom teeth are missing Neck: No JVD, no masses, no thyromegaly. Lungs: Lungs clear bilaterally.  No labored breathing or cough. Heart: RRR.  No MRG.  S1, S2 present Abdomen: Soft.  Not tender or distended.  No HSM, masses, bruits.   Rectal: Deferred.  This was performed by the ED provider and there was bright red blood on the exam glove and scant palpable stool in the rectal vault.  No external hemorrhoids. Musc/Skeltl: No joint redness, swelling or significant deformity Extremities: No CCE. Neurologic: Alert.  Oriented x3.  No limb weakness or tremors.  No gross deficits Skin: Rashes, sores, suspicious lesions Tattoos: None Nodes: No cervical or inguinal adenopathy. Psych: Pleasant, cooperative, fluid speech, calm.  Intake/Output from previous day: No intake/output data recorded. Intake/Output this shift: No intake/output data recorded.  LAB RESULTS: Recent Labs    12/14/17 1102  WBC 15.0*  HGB 8.1*  HCT 27.3*  PLT 341   BMET Lab Results  Component Value Date   NA 141 12/14/2017   NA 133 (L) 02/12/2017   NA 135 02/07/2017   K 4.3 12/14/2017   K 3.9 02/12/2017   K 4.4 02/07/2017   CL 110 12/14/2017   CL 103 02/12/2017   CL 106 02/07/2017   CO2 19 (L) 12/14/2017   CO2 22 02/12/2017   CO2 21 (L) 02/07/2017   GLUCOSE 177 (H) 12/14/2017   GLUCOSE 119 (H) 02/12/2017   GLUCOSE 112 (H) 02/07/2017   BUN 17 12/14/2017   BUN 13 02/12/2017   BUN 8 02/07/2017   CREATININE 1.03 12/14/2017   CREATININE 1.15 02/12/2017   CREATININE 0.86 02/07/2017   CALCIUM 9.5 12/14/2017  CALCIUM 9.1 02/12/2017   CALCIUM 9.3 02/07/2017   LFT No results for input(s): PROT, ALBUMIN, AST, ALT, ALKPHOS, BILITOT, BILIDIR, IBILI in the last 72 hours. PT/INR Lab Results  Component Value Date   INR 1.11 02/06/2017   INR 1.11 10/18/2016   INR 1.01 10/17/2016   Hepatitis  Panel No results for input(s): HEPBSAG, HCVAB, HEPAIGM, HEPBIGM in the last 72 hours. C-Diff No components found for: CDIFF Lipase  No results found for: LIPASE  Drugs of Abuse  No results found for: LABOPIA, COCAINSCRNUR, LABBENZ, AMPHETMU, THCU, LABBARB   RADIOLOGY STUDIES: No results found.   IMPRESSION:   *   Painless hematochezia.  This was limited to 2 episodes this morning.  Suspect diverticular bleed.  Very recent colonoscopy 11 months ago confirmed diverticulosis  *   History of chronic anemia.  Hgb of 8.1 compared with 8.9 eleven months ago. Patient did not ever pursue the capsule endoscopy.    PLAN:     *   Observe.  Repeat Hgb in the morning.  If he starts rebleeding would pursue nuclear medicine tagged red blood cell scan.   If this is positive for active bleeding would proceed angiography.  *    No indication for inpatient capsule endoscopy.   Azucena Freed  12/14/2017, 1:37 PM Phone [phone]

## 2017-12-14 NOTE — H&P (Signed)
Referring Physician:  [name] is an 72 y.o. male.                       Chief Complaint: Dizziness and rectal bleed  HPI: 72 year old male with PMH of Iron deficiency anemia, CAD, S/P LAD stenting in 09/2016, H/O iron deficiency anemia, S/P EGD and Colonoscopy, Hypertension and type 2 DM had dizziness and near syncopal episode. Patient also had bright red bleeding from rectum. He denies black stools, chest pain or palpitations. No fever or nausea or vomiting. His Hgb is down to 8.1.   Past Medical History:  Diagnosis Date  . Anemia 01/2017  . Coronary artery disease   . DM (diabetes mellitus) ([place])   . Dyspnea       Past Surgical History:  Procedure Laterality Date  . APPENDECTOMY    . CARDIAC CATHETERIZATION    . COLONOSCOPY WITH PROPOFOL N/A 02/10/2017   Procedure: COLONOSCOPY WITH PROPOFOL;  Surgeon: Gatha Mayer, MD;  Location: [facility] ENDOSCOPY;  Service: Endoscopy;  Laterality: N/A;  . CORONARY ANGIOPLASTY    . CORONARY STENT INTERVENTION N/A 10/18/2016   Procedure: CORONARY STENT INTERVENTION;  Surgeon: Dixie Dials, MD;  Location: [place] [facility] LAB;  Service: Cardiovascular;  Laterality: N/A;  . ESOPHAGOGASTRODUODENOSCOPY N/A 02/09/2017   Procedure: ESOPHAGOGASTRODUODENOSCOPY (EGD);  Surgeon: Gatha Mayer, MD;  Location[facility] ENDOSCOPY;  Service: Endoscopy;  Laterality: N/A;  . LEFT HEART CATH AND CORONARY ANGIOGRAPHY N/A 10/18/2016   Procedure: LEFT HEART CATH AND CORONARY ANGIOGRAPHY;  Surgeon: Dixie Dials, MD;  Location: [place] [facility] LAB;  Service: Cardiovascular;  Laterality: N/A;    No family history on file. Social History:  reports that he has quit smoking. He has quit using smokeless tobacco. He reports that he does not drink alcohol or use drugs.  Allergies: No Known Allergies   (Not in a hospital admission)  Results for orders placed or performed during the hospital encounter of 12/14/17 (from the past 48 hour(s))  CBC with Differential      Status: Abnormal   Collection Time: 12/14/17 11:02 AM  Result Value Ref Range   WBC 15.0 (H) 4.0 - 10.5 K/uL   RBC 3.35 (L) 4.22 - 5.81 MIL/uL   Hemoglobin 8.1 (L) 13.0 - 17.0 g/dL   HCT 27.3 (L) 39.0 - 52.0 %   MCV 81.5 80.0 - 100.0 fL   MCH 24.2 (L) 26.0 - 34.0 pg   MCHC 29.7 (L) 30.0 - 36.0 g/dL   RDW 18.5 (H) 11.5 - 15.5 %   Platelets 341 150 - 400 K/uL   nRBC 0.0 0.0 - 0.2 %   Neutrophils Relative % 91 %   Neutro Abs 13.5 (H) 1.7 - 7.7 K/uL   Lymphocytes Relative 6 %   Lymphs Abs 0.9 0.7 - 4.0 K/uL   Monocytes Relative 3 %   Monocytes Absolute 0.4 0.1 - 1.0 K/uL   Eosinophils Relative 0 %   Eosinophils Absolute 0.0 0.0 - 0.5 K/uL   Basophils Relative 0 %   Basophils Absolute 0.1 0.0 - 0.1 K/uL   Immature Granulocytes 0 %   Abs Immature Granulocytes 0.05 0.00 - 0.07 K/uL    Comment: Performed at [facility], [address], [place], [place] [address]  Basic metabolic panel     Status: Abnormal   Collection Time: 12/14/17 11:02 AM  Result Value Ref Range   Sodium 141 135 - 145 mmol/L   Potassium  4.3 3.5 - 5.1 mmol/L   Chloride 110 98 - 111 mmol/L   CO2 19 (L) 22 - 32 mmol/L   Glucose, Bld 177 (H) 70 - 99 mg/dL   BUN 17 8 - 23 mg/dL   Creatinine, Ser 1.03 0.61 - 1.24 mg/dL   Calcium 9.5 8.9 - 10.3 mg/dL   GFR calc non Af Amer >60 >60 mL/min   GFR calc Af Amer >60 >60 mL/min    Comment: (NOTE) The eGFR has been calculated using the CKD EPI equation. This calculation has not been validated in all clinical situations. eGFR's persistently <60 mL/min signify possible Chronic Kidney Disease.    Anion gap 12 5 - 15    Comment: Performed at [facility] [address], [place], [place] [address]  POC occult blood, ED Provider will collect     Status: Abnormal   Collection Time: 12/14/17 11:38 AM  Result Value Ref Range   Fecal Occult Bld POSITIVE (A) NEGATIVE  I-Stat Troponin, ED (not at [facility])     Status: None   Collection Time: 12/14/17 12:17 PM  Result  Value Ref Range   Troponin i, poc 0.00 0.00 - 0.08 ng/mL   Comment 3            Comment: Due to the release kinetics of cTnI, a negative result within the first hours of the onset of symptoms does not rule out myocardial infarction with certainty. If myocardial infarction is still suspected, repeat the test at appropriate intervals.    No results found.  Review Of Systems Constitutional: No fever, chills, weight loss or gain. Eyes: No vision change, wears glasses. No discharge or pain. Ears: No hearing loss, No tinnitus. Respiratory: No asthma, COPD, pneumonias. Positive shortness of breath. No hemoptysis. Cardiovascular: No chest pain, palpitation, leg edema. Gastrointestinal: No nausea, vomiting, diarrhea, constipation. No GI bleed. No hepatitis. Genitourinary: No dysuria, hematuria, kidney stone. No incontinance. Positive GI bleed. Neurological: No headache, stroke, seizures.  Psychiatry: No psych facility admission for anxiety, depression, suicide. No detox. Skin: No rash. Musculoskeletal: Positive joint pain, no fibromyalgia. No neck pain, back pain. Lymphadenopathy: No lymphadenopathy. Hematology: Positive anemia, easy bruising.   Blood pressure 125/64, pulse 77, temperature (!) 97.4 F (36.3 C), temperature source Oral, resp. rate 16, SpO2 100 %. There is no height or weight on file to calculate BMI. General appearance: alert, cooperative, appears stated age and no distress Head: Normocephalic, atraumatic. Eyes: Brown eyes, pale conjunctiva, corneas clear. PERRL, EOM's intact. Throat: Pale midline tongue. Neck: No adenopathy, no carotid bruit, no JVD, supple, symmetrical, trachea midline and thyroid not enlarged. Resp: Clear to auscultation bilaterally. Cardio: Regular rate and rhythm, S1, S2 normal, II/VI systolic murmur, no click, rub or gallop GI: Soft, non-tender; bowel sounds normal; no organomegaly. Extremities: No edema, cyanosis or clubbing. Skin: Warm and dry.   Neurologic: Alert and oriented X 3, normal strength. Normal coordination and gait.  Assessment/Plan Dizziness Acute lower GI bleed Type 2 DM S/P stent in LAD Hypertension Chronic iron deficiency anemia  Admit. GI consult. Hold Aspirin and Plavix.  Birdie Riddle, MD  12/14/2017, 12:57 PM

## 2017-12-14 NOTE — ED Triage Notes (Signed)
Pt form work via ems; pt c/o dizziness,. lightheadness, n/v, had near syncopal episode; feeling better on ems arrival; 12 lead unremarkable; not c/o pain, no sob; ; no hx MI; per pt, pt normally hypertensive; manual 047-533 systolic; hands very cool to touch; took home meds this am; Hx, HTN, DM, CAD  CBG 158 137/71 P 70 90% 3L

## 2017-12-14 NOTE — ED Provider Notes (Signed)
[facility] EMERGENCY DEPARTMENT Provider Note   CSN: [id number] Arrival date & time: 12/14/17  1017     History   Chief Complaint Chief Complaint  Patient presents with  . Nausea  . Near Syncope    HPI Bryan Roy is a 72 y.o. male with history of anemia, CAD, diabetes mellitus presents for evaluation of acute onset, resolved episode of lightheadedness and nausea.  He states that at around 8:30 AM he had a bowel movement and noted bright red blood per rectum.  Denies any pain with a bowel movement.  Shortly thereafter he began to feel nauseous and lightheaded.  This lasted for approximately half an hour and then resolved entirely and he states he feels back to baseline.  Denies any headache, numbness, tingling, chest pain, shortness of breath, abdominal pain, or vomiting.  States that he has had good oral intake as of late and denies any recent illnesses or fever.  States he saw his PCP recently and obtain blood work but does not know the results of this.  He has required admission in the past for anemia in which she received 2 units of blood and underwent endoscopy which was inconclusive for a source of GI bleed, but internal hemmorrhoids were noted.  The history is provided by the patient.    Past Medical History:  Diagnosis Date  . Anemia 01/2017  . Coronary artery disease   . DM (diabetes mellitus) ([place])   . Dyspnea     Patient Active Problem List   Diagnosis Date Noted  . Acute lower GI bleeding 12/14/2017  . Iron deficiency anemia   . Reflux esophagitis   . Erosive gastritis   . Shortness of breath 02/06/2017  . Anemia due to blood loss, chronic 02/06/2017  . Abnormal stress test 10/18/2016  . Chest pain, precordial 10/17/2016    Class: Acute  . Essential hypertension 10/17/2016  . Acute coronary syndrome ([place]) 10/17/2016    Past Surgical History:  Procedure Laterality Date  . APPENDECTOMY    . CARDIAC CATHETERIZATION    . COLONOSCOPY  WITH PROPOFOL N/A 02/10/2017   Procedure: COLONOSCOPY WITH PROPOFOL;  Surgeon: Gatha Mayer, MD;  Location: [facility] ENDOSCOPY;  Service: Endoscopy;  Laterality: N/A;  . CORONARY ANGIOPLASTY    . CORONARY STENT INTERVENTION N/A 10/18/2016   Procedure: CORONARY STENT INTERVENTION;  Surgeon: Dixie Dials, MD;  Location: [facility] CV LAB;  Service: Cardiovascular;  Laterality: N/A;  . ESOPHAGOGASTRODUODENOSCOPY N/A 02/09/2017   Procedure: ESOPHAGOGASTRODUODENOSCOPY (EGD);  Surgeon: Gatha Mayer, MD;  Location: [facility] ENDOSCOPY;  Service: Endoscopy;  Laterality: N/A;  . LEFT HEART CATH AND CORONARY ANGIOGRAPHY N/A 10/18/2016   Procedure: LEFT HEART CATH AND CORONARY ANGIOGRAPHY;  Surgeon: Dixie Dials, MD;  Location: [facility] CV LAB;  Service: Cardiovascular;  Laterality: N/A;        Home Medications    Prior to Admission medications   Medication Sig Start Date End Date Taking? Authorizing Provider  amLODipine (NORVASC) 5 MG tablet Take 1 tablet (5 mg total) by mouth daily. Patient taking differently: Take 5 mg by mouth 2 (two) times daily.  10/19/16   Dixie Dials, MD  aspirin EC 81 MG EC tablet Take 1 tablet (81 mg total) by mouth daily. 10/19/16   Dixie Dials, MD  atorvastatin (LIPITOR) 40 MG tablet Take 1 tablet (40 mg total) by mouth daily at 6 PM. 10/19/16   Dixie Dials, MD  clopidogrel (PLAVIX) 75 MG tablet Take 1 tablet (  75 mg total) by mouth daily. 02/13/17   Charolette Forward, MD  ferrous sulfate 325 (65 FE) MG EC tablet Take 1 tablet (325 mg total) by mouth 2 (two) times daily. 02/12/17 02/12/18  Charolette Forward, MD  hydrALAZINE (APRESOLINE) 25 MG tablet Take 25 mg by mouth 2 (two) times daily.    [provider]  losartan (COZAAR) 100 MG tablet Take 100 mg by mouth daily.    [provider]  metFORMIN (GLUCOPHAGE) 500 MG tablet Take 1 tablet (500 mg total) by mouth 2 (two) times daily with a meal. Start after 2 days. Patient taking differently: Take 500 mg by mouth  daily with breakfast.  10/19/16   Dixie Dials, MD  metoprolol tartrate (LOPRESSOR) 25 MG tablet Take 12.5 mg by mouth 2 (two) times daily.     [provider]  nitroGLYCERIN (NITROSTAT) 0.4 MG SL tablet Place 1 tablet (0.4 mg total) under the tongue every 5 (five) minutes x 3 doses as needed for chest pain. 10/19/16   Dixie Dials, MD  pantoprazole (PROTONIX) 40 MG tablet Take 40 mg by mouth See admin instructions. Take one tablet (40mg ) by mouth prior to taking a Nitro. 01/23/17   [provider]    Family History No family history on file.  Social History Social History   Tobacco Use  . Smoking status: Former Research scientist (life sciences)  . Smokeless tobacco: Former Network engineer Use Topics  . Alcohol use: No  . Drug use: No     Allergies   Patient has no known allergies.   Review of Systems Review of Systems  Constitutional: Negative for fatigue and fever.  Respiratory: Negative for shortness of breath.   Cardiovascular: Positive for near-syncope. Negative for chest pain.  Gastrointestinal: Positive for blood in stool. Negative for abdominal pain, nausea and vomiting.  Neurological: Positive for light-headedness. Negative for headaches.  All other systems reviewed and are negative.    Physical Exam Updated Vital Signs BP 125/64   Pulse 77   Temp (!) 97.4 F (36.3 C) (Oral)   Resp 16   SpO2 100%   Physical Exam  Constitutional: He appears well-developed and well-nourished. No distress.  HENT:  Head: Normocephalic and atraumatic.  Eyes: Conjunctivae are normal. Right eye exhibits no discharge. Left eye exhibits no discharge.  Neck: Normal range of motion. Neck supple. No JVD present. No tracheal deviation present.  Cardiovascular: Normal rate, regular rhythm and normal heart sounds.  Pulmonary/Chest: Effort normal and breath sounds normal.  Abdominal: Soft. Bowel sounds are normal. He exhibits no distension. There is no tenderness. There is no guarding.    Genitourinary:  Genitourinary Comments: Examination performed in the presence of chaperone.  No anal fissures or tears noted.  Bright red blood on glove noted with scant stool in the rectal vault.  No external hemorrhoids.  Musculoskeletal: He exhibits no edema.  Neurological: He is alert.  Skin: Skin is warm and dry. No erythema.  Psychiatric: He has a normal mood and affect. His behavior is normal.  Nursing note and vitals reviewed.    ED Treatments / Results  Labs (all labs ordered are listed, but only abnormal results are displayed) Labs Reviewed  CBC WITH DIFFERENTIAL/PLATELET - Abnormal; Notable for the following components:      Result Value   WBC 15.0 (*)    RBC 3.35 (*)    Hemoglobin 8.1 (*)    HCT 27.3 (*)    MCH 24.2 (*)    MCHC 29.7 (*)  RDW 18.5 (*)    Neutro Abs 13.5 (*)    All other components within normal limits  BASIC METABOLIC PANEL - Abnormal; Notable for the following components:   CO2 19 (*)    Glucose, Bld 177 (*)    All other components within normal limits  POC OCCULT BLOOD, ED - Abnormal; Notable for the following components:   Fecal Occult Bld POSITIVE (*)    All other components within normal limits  I-STAT TROPONIN, ED    EKG EKG Interpretation  Date/Time:  Thursday December 14 2017 11:05:56 EDT Ventricular Rate:  79 PR Interval:  156 QRS Duration: 76 QT Interval:  388 QTC Calculation: 444 R Axis:   66 Text Interpretation:  Normal sinus rhythm Cannot rule out Anterior infarct , age undetermined Abnormal ECG No STEMI.  Confirmed by Nanda Quinton [phone]) on 12/14/2017 11:13:15 AM   Radiology No results found.  Procedures Procedures (including critical care time)  Medications Ordered in ED Medications  0.9 %  sodium chloride infusion (has no administration in time range)  amLODipine (NORVASC) tablet 5 mg (has no administration in time range)  atorvastatin (LIPITOR) tablet 40 mg (has no administration in time range)  ferrous  sulfate EC tablet 325 mg (has no administration in time range)  hydrALAZINE (APRESOLINE) tablet 25 mg (has no administration in time range)  losartan (COZAAR) tablet 100 mg (has no administration in time range)  metFORMIN (GLUCOPHAGE) tablet 500 mg (has no administration in time range)  metoprolol tartrate (LOPRESSOR) tablet 12.5 mg (has no administration in time range)  nitroGLYCERIN (NITROSTAT) SL tablet 0.4 mg (has no administration in time range)  pantoprazole (PROTONIX) EC tablet 40 mg (has no administration in time range)     Initial Impression / Assessment and Plan / ED Course  I have reviewed the triage vital signs and the nursing notes.  Pertinent labs & imaging results that were available during my care of the patient were reviewed by me and considered in my medical decision making (see chart for details).     Patient presents for evaluation of acute onset of red blood per rectum and near syncopal episode.  He is afebrile, vital signs are stable.  He is nontoxic in appearance.  Abdomen is soft and nontender.  There was bright red blood on rectal exam.  Hemoglobin today of 8.1, no recent hemoglobin to compare to.  He is not orthostatic.  Remainder of lab work is reassuring.  EKG shows no ischemic changes and troponin is normal.  Doubt ACS/MI.  However with history of significant cardiac disease with bright red blood per rectum, feel he would benefit from admission for observation and trending of hemoglobin.  Discussed with Dr. Doylene Canard who agrees to assume care of patient and bring him into the hospital for further evaluation and management.  Final Clinical Impressions(s) / ED Diagnoses   Final diagnoses:  BRBPR (bright red blood per rectum)  Anemia, unspecified type    ED Discharge Orders    None       Debroah Baller 12/14/17 1304    Margette Fast, MD 12/14/17 Vernelle Emerald

## 2017-12-14 NOTE — Progress Notes (Signed)
Pt admitted to [facility] room 10. A&O x4. Skin intact. Tele monitor placed. All questions/concerns addressed. Call bell within reach. Will continue to monitor.

## 2017-12-15 ENCOUNTER — Other Ambulatory Visit: Payer: Self-pay | Admitting: *Deleted

## 2017-12-15 DIAGNOSIS — K625 Hemorrhage of anus and rectum: Secondary | ICD-10-CM

## 2017-12-15 DIAGNOSIS — D649 Anemia, unspecified: Secondary | ICD-10-CM

## 2017-12-15 LAB — BASIC METABOLIC PANEL
Anion gap: 7 (ref 5–15)
BUN: 13 mg/dL (ref 8–23)
CALCIUM: 9 mg/dL (ref 8.9–10.3)
CO2: 22 mmol/L (ref 22–32)
CREATININE: 0.98 mg/dL (ref 0.61–1.24)
Chloride: 111 mmol/L (ref 98–111)
GFR calc Af Amer: >60 mL/min (ref >60)
GLUCOSE: 132 mg/dL — AB (ref 70–99)
Potassium: 4.5 mmol/L (ref 3.5–5.1)
Sodium: 140 mmol/L (ref 135–145)

## 2017-12-15 LAB — CBC
HCT: 22.4 % — ABNORMAL LOW (ref 39.0–52.0)
Hemoglobin: 6.8 g/dL — CL (ref 13.0–17.0)
MCH: 24.1 pg — AB (ref 26.0–34.0)
MCHC: 30.4 g/dL (ref 30.0–36.0)
MCV: 79.4 fL — AB (ref 80.0–100.0)
PLATELETS: 290 10*3/uL (ref 150–400)
RBC: 2.82 MIL/uL — ABNORMAL LOW (ref 4.22–5.81)
RDW: 18 % — AB (ref 11.5–15.5)
WBC: 7.7 10*3/uL (ref 4.0–10.5)
nRBC: 0 % (ref 0.0–0.2)

## 2017-12-15 LAB — IRON AND TIBC
Iron: 66 ug/dL (ref 45–182)
Saturation Ratios: 16 % — ABNORMAL LOW (ref 17.9–39.5)
TIBC: 407 ug/dL (ref 250–450)
UIBC: 341 ug/dL

## 2017-12-15 LAB — PROTIME-INR
INR: 1.19
Prothrombin Time: 15 seconds (ref 11.4–15.2)

## 2017-12-15 LAB — FERRITIN: FERRITIN: 7 ng/mL — AB (ref 24–336)

## 2017-12-15 LAB — PREPARE RBC (CROSSMATCH)

## 2017-12-15 MED ORDER — SODIUM CHLORIDE 0.9% IV SOLUTION
Freq: Once | INTRAVENOUS | Status: AC
  Administered 2017-12-15: 13:00:00 via INTRAVENOUS

## 2017-12-15 NOTE — Patient Outreach (Signed)
[facility]) Care Management  12/15/2017  Bryan Roy 22-Jun-1945 [id number]   Telephone Screen  Referral Date: 12/15/17 (CM received this referral on 12/15/17)  Referral Source: MD referral Ellan Lambert- Jeanne Ivan)  Referral Reason: Medication compliance; chronic disease management  Insurance: [place] admission x 2  ED visit x 2  in the last 6 months    Outreach attempt # 1 successful to the patient who confirms with [facility] RN CM that he has been in the hospital since Thursday 12/14/17, he is not sure when he will be discharged  Patient is able to verify HIPAA Reviewed and addressed referral to [facility] with patient He gives CM permission on today 12/15/17 to also speak with his sister, Bryan Roy, because she is the care giver and he is staying with her   CM explained to Mr Tolson that [facility] staff generally speak with patients once they have discharged home and CM will make another attempt to reach him or his sister within 4 business days.  He agrees to a return call after discharge  Plan: [facility] RN CM updated [facility] CMA, and hospital liaisons  [facility] RN CM scheduled this patient for another call attempt within 4 business days    Kimberly L. Lavina Hamman, RN, BSN, [place] Coordinator Office number [phone] Mobile number [phone]  Main [facility] number [phone] Fax number [phone]

## 2017-12-15 NOTE — Progress Notes (Signed)
Ref: Dixie Dials, MD   Subjective:  Patient report additional rectal bleed. Hgb is down to 6.8. Patient denies weakness or dizziness. VS stable.  Objective:  Vital Signs in the last 24 hours: Temp:  [98.2 F (36.8 C)-99.4 F (37.4 C)] 98.4 F (36.9 C) (10/25 0554) Pulse Rate:  [60-77] 60 (10/25 0554) Cardiac Rhythm: Sinus bradycardia;Normal sinus rhythm (10/25 0705) Resp:  [13-20] 17 (10/25 0554) BP: (116-154)/(64-84) 146/84 (10/25 1030) SpO2:  [99 %-100 %] 100 % (10/25 0554)  Physical Exam: BP Readings from Last 1 Encounters:  12/15/17 (!) 146/84     Wt Readings from Last 1 Encounters:  02/12/17 61.8 kg    Weight change:  There is no height or weight on file to calculate BMI. HEENT: [place]/AT, Eyes-Brown, PERL, EOMI, Conjunctiva-Pale, Sclera-Non-icteric Neck: No JVD, No bruit, Trachea midline. Lungs:  Clear, Bilateral. Cardiac:  Regular rhythm, normal S1 and S2, no S3. II/VI systolic murmur. Abdomen:  Soft, non-tender. BS present. Extremities:  No edema present. No cyanosis. No clubbing. CNS: AxOx3, Cranial nerves grossly intact, moves all 4 extremities.  Skin: Warm and dry.   Intake/Output from previous day: 10/24 0701 - 10/25 0700 In: 932.5 [P.O.:120; I.V.:812.5] Out: 200 [Urine:200]    Lab Results: BMET    Component Value Date/Time   NA 140 12/15/2017 0501   NA 141 12/14/2017 1102   NA 133 (L) 02/12/2017 0444   K 4.5 12/15/2017 0501   K 4.3 12/14/2017 1102   K 3.9 02/12/2017 0444   CL 111 12/15/2017 0501   CL 110 12/14/2017 1102   CL 103 02/12/2017 0444   CO2 22 12/15/2017 0501   CO2 19 (L) 12/14/2017 1102   CO2 22 02/12/2017 0444   GLUCOSE 132 (H) 12/15/2017 0501   GLUCOSE 177 (H) 12/14/2017 1102   GLUCOSE 119 (H) 02/12/2017 0444   BUN 13 12/15/2017 0501   BUN 17 12/14/2017 1102   BUN 13 02/12/2017 0444   CREATININE 0.98 12/15/2017 0501   CREATININE 1.03 12/14/2017 1102   CREATININE 1.15 02/12/2017 0444   CALCIUM 9.0 12/15/2017 0501   CALCIUM 9.5  12/14/2017 1102   CALCIUM 9.1 02/12/2017 0444   GFRNONAA >60 12/15/2017 0501   GFRNONAA >60 12/14/2017 1102   GFRNONAA >60 02/12/2017 0444   GFRAA >60 12/15/2017 0501   GFRAA >60 12/14/2017 1102   GFRAA >60 02/12/2017 0444   CBC    Component Value Date/Time   WBC 7.7 12/15/2017 0501   RBC 2.82 (L) 12/15/2017 0501   HGB 6.8 (LL) 12/15/2017 0501   HCT 22.4 (L) 12/15/2017 0501   PLT 290 12/15/2017 0501   MCV 79.4 (L) 12/15/2017 0501   MCH 24.1 (L) 12/15/2017 0501   MCHC 30.4 12/15/2017 0501   RDW 18.0 (H) 12/15/2017 0501   LYMPHSABS 0.9 12/14/2017 1102   MONOABS 0.4 12/14/2017 1102   EOSABS 0.0 12/14/2017 1102   BASOSABS 0.1 12/14/2017 1102   HEPATIC Function Panel Recent Labs    02/06/17 1119  PROT 7.3   HEMOGLOBIN A1C No components found for: HGA1C,  MPG CARDIAC ENZYMES Lab Results  Component Value Date   TROPONINI <0.03 10/18/2016   TROPONINI <0.03 10/17/2016   TROPONINI <0.03 10/17/2016   BNP No results for input(s): PROBNP in the last 8760 hours. TSH No results for input(s): TSH in the last 8760 hours. CHOLESTEROL No results for input(s): CHOL in the last 8760 hours.  Scheduled Meds: . sodium chloride   Intravenous Once  . amLODipine  5 mg Oral  Daily  . atorvastatin  40 mg Oral q1800  . ferrous sulfate  325 mg Oral BID WC  . hydrALAZINE  25 mg Oral BID  . losartan  100 mg Oral Daily  . metFORMIN  500 mg Oral Q breakfast  . metoprolol tartrate  12.5 mg Oral BID  . pantoprazole  40 mg Oral Daily   Continuous Infusions: . sodium chloride 50 mL/hr at 12/15/17 0830   PRN Meds:.nitroGLYCERIN  Assessment/Plan: Acute rectal bleed Possible divereticular bleed Type 2 DM CAD S/P stent in LAD Hypertension Anemia, acute blood loss and iron deficiency  Transfuse 2 units of blood.   LOS: 1 day    Dixie Dials  MD  12/15/2017, 10:53 AM

## 2017-12-15 NOTE — Progress Notes (Signed)
          Daily Rounding Note  12/15/2017, 11:39 AM  LOS: 1 day   SUBJECTIVE:   Chief complaint: Painless hematochezia. Had another episode of hematochezia in the around 9 AM today.  This was moderate volume.  Still not having abdominal pain. Hgb has dropped but he does not feel dizzy, weak.  Has not had any tachycardia or chest pain with getting up and walking to the bathroom.  OBJECTIVE:         Vital signs in last 24 hours:    Temp:  [98.2 F (36.8 C)-99.4 F (37.4 C)] 98.4 F (36.9 C) (10/25 0554) Pulse Rate:  [60-77] 60 (10/25 0554) Resp:  [13-20] 17 (10/25 0554) BP: (116-154)/(64-84) 146/84 (10/25 1030) SpO2:  [99 %-100 %] 100 % (10/25 0554)   General: Looks well.  Fully alert.  Comfortable. Heart: RRR. Chest: Clear bilaterally.  No labored breathing. Abdomen: Nontender, soft.  Active bowel sounds. Extremities: CCE. Neuro/Psych: Alert.  Oriented x3.  No tremors, no limb weakness.  Intake/Output from previous day: 10/24 0701 - 10/25 0700 In: 932.5 [P.O.:120; I.V.:812.5] Out: 200 [Urine:200]  Intake/Output this shift: Total I/O In: 300 [P.O.:300] Out: -   Lab Results: Recent Labs    12/14/17 1102 12/15/17 0501  WBC 15.0* 7.7  HGB 8.1* 6.8*  HCT 27.3* 22.4*  PLT 341 290   BMET Recent Labs    12/14/17 1102 12/15/17 0501  NA 141 140  K 4.3 4.5  CL 110 111  CO2 19* 22  GLUCOSE 177* 132*  BUN 17 13  CREATININE 1.03 0.98  CALCIUM 9.5 9.0   LFT No results for input(s): PROT, ALBUMIN, AST, ALT, ALKPHOS, BILITOT, BILIDIR, IBILI in the last 72 hours. PT/INR Recent Labs    12/15/17 0501  LABPROT 15.0  INR 1.19   Scheduled Meds: . sodium chloride   Intravenous Once  . amLODipine  5 mg Oral Daily  . atorvastatin  40 mg Oral q1800  . ferrous sulfate  325 mg Oral BID WC  . hydrALAZINE  25 mg Oral BID  . losartan  100 mg Oral Daily  . metFORMIN  500 mg Oral Q breakfast  . metoprolol tartrate  12.5  mg Oral BID  . pantoprazole  40 mg Oral Daily   Continuous Infusions: . sodium chloride 50 mL/hr at 12/15/17 0830   PRN Meds:.nitroGLYCERIN   ASSESMENT:    *   Painless hematochezia.  2 episodes yest AM, 1 episode this AM.  Still no pain. Very recent colonoscopy 11 months ago confirmed diverticulosis  *   History of chronic anemia.  Hgb of 8.1 compared with 8.9 eleven months ago, has dropped to 6.8 today.. No symptoms from anemia.     PLAN   *   CBC in the morning. Continue his normal regimen of Protonix 40 mg daily.  He also remains on his chronic twice daily iron   *    Defer decision regarding PRBC transfusion to Dr. Campbell Lerner  12/15/2017, 11:39 AM Phone [phone]

## 2017-12-15 NOTE — Consult Note (Signed)
   [facility] Inpatient Consult   12/15/2017  Bryan Roy [birth year] [id number]  Thank you for this consult.  Patient evaluated for [place] Management services.  Patient is not currently a beneficiary of the attributed [place] in the [employer].  His Primary Care Provider is not a [facility] primary care provider or is not [facility] affiliated.  Spoke with patient at the bedside regarding primary care and needs for services. This patient is Not currently eligible for [facility] Care Management Services. Patient has never been to [facility] he states.  Reason:  Not a beneficiary currently attributed to one of the [place].  Membership roster was used to verify non- eligible status patient has not had any appointments with MD listed.  [name], RN BSN [facility] Liaison  [phone] business mobile phone Toll free office [phone]

## 2017-12-15 NOTE — Progress Notes (Signed)
CRITICAL VALUE ALERT  Critical Value:  Hgb = 6.8  Date & Time Notied:  12/15/17 ,  6808  Provider Notified:  Dr. Lyndel Safe  Orders Received/Actions taken:  Waiting for orders

## 2017-12-16 DIAGNOSIS — Z7902 Long term (current) use of antithrombotics/antiplatelets: Secondary | ICD-10-CM

## 2017-12-16 LAB — CBC
HEMATOCRIT: 27.1 % — AB (ref 39.0–52.0)
HEMOGLOBIN: 8.5 g/dL — AB (ref 13.0–17.0)
MCH: 25.6 pg — AB (ref 26.0–34.0)
MCHC: 31.4 g/dL (ref 30.0–36.0)
MCV: 81.6 fL (ref 80.0–100.0)
PLATELETS: 243 10*3/uL (ref 150–400)
RBC: 3.32 MIL/uL — AB (ref 4.22–5.81)
RDW: 17.7 % — ABNORMAL HIGH (ref 11.5–15.5)
WBC: 8 10*3/uL (ref 4.0–10.5)
nRBC: 0 % (ref 0.0–0.2)

## 2017-12-16 NOTE — Progress Notes (Signed)
Subjective:  Patient denies any chest pain or shortness of breath. Denies abdominal pain. No further obvious bleeding. Patient received 2 units of packed RBCs with appropriate increase in his hemoglobin.  Objective:  Vital Signs in the last 24 hours: Temp:  [97.9 F (36.6 C)-99.2 F (37.3 C)] 98.6 F (37 C) (10/26 0418) Pulse Rate:  [57-65] 60 (10/26 0418) Resp:  [16-18] 16 (10/26 0418) BP: (133-149)/(62-83) 149/69 (10/26 0418) SpO2:  [98 %-100 %] 99 % (10/26 0418)  Intake/Output from previous day: 10/25 0701 - 10/26 0700 In: 2484.9 [P.O.:600; I.V.:1236.9; Blood:648] Out: 100 [Urine:100] Intake/Output from this shift: Total I/O In: 250 [I.V.:250] Out: -   Physical Exam: Neck: no adenopathy, no carotid bruit, no JVD and supple, symmetrical, trachea midline Lungs: clear to auscultation bilaterally Heart: regular rate and rhythm, S1, S2 normal and 2/6 systolic murmur noted Abdomen: soft, non-tender; bowel sounds normal; no masses,  no organomegaly Extremities: extremities normal, atraumatic, no cyanosis or edema  Lab Results: Recent Labs    12/15/17 0501 12/16/17 0626  WBC 7.7 8.0  HGB 6.8* 8.5*  PLT 290 243   Recent Labs    12/14/17 1102 12/15/17 0501  NA 141 140  K 4.3 4.5  CL 110 111  CO2 19* 22  GLUCOSE 177* 132*  BUN 17 13  CREATININE 1.03 0.98   No results for input(s): TROPONINI in the last 72 hours.  Invalid input(s): CK, MB Hepatic Function Panel No results for input(s): PROT, ALBUMIN, AST, ALT, ALKPHOS, BILITOT, BILIDIR, IBILI in the last 72 hours. No results for input(s): CHOL in the last 72 hours. No results for input(s): PROTIME in the last 72 hours.  Imaging: Imaging results have been reviewed and No results found.  Cardiac Studies:  Assessment/Plan:  Status post acute lower GI bleed Possible diverticular bleed Type 2 DM CAD S/P stent in LAD Hypertension Anemia, acute blood loss and iron deficiency Plan Continue present  management Check labs in a.m. Awaiting GI recommendation Hold aspirin and Plavix  LOS: 2 days    Charolette Forward 12/16/2017, 12:17 PM

## 2017-12-16 NOTE — Progress Notes (Addendum)
     [facility] Progress Note   Chief Complaint:  Rectal bleeding    SUBJECTIVE:   Feels okay. No fresh blood from rectum today. He had a hard stool with dark blood this am.     ASSESSMENT AND PLAN:   1. Painless hematochezia. Diverticulosis on colonoscopy Dec 2018.  -no further red blood. BM this am was partly firm with dark blood.  -Hopefully bleeding has ceased. He is asking about discharge. Will give soft diet and if no bleeding today then maybe home in am.   2. Acute on chronic anemia. Difficult to discern baseline hgb but was 9 in late December. Hgb 6.8 this admission -got a unit of 2 units pRBC, hgb up appropriately to 8.5.   3. Long-term use clopidogrel and ASA - held as of 10/24 so anti-PLT effects still active    [facility] Attending   I have taken an interval history, reviewed the chart and examined the patient. I agree with the Advanced Practitioner's note, impression and recommendations.    Working dx is diverticular hemorrhage Continue to observe - if persistent bleeding consider bleeding scan Would prefer 24-48 hrs w/o bleeding  Remember that clopidogrel is still active - could transfuse PLT's if more bleeding - would do that prior to bleeding scan I think  Gatha Mayer, MD, [facility] 12/16/2017 7:47 PM Pager [phone]   OBJECTIVE:     Vital signs in last 24 hours: Temp:  [97.9 F (36.6 C)-99.2 F (37.3 C)] 98.6 F (37 C) (10/26 0418) Pulse Rate:  [57-65] 60 (10/26 0418) Resp:  [16-18] 16 (10/26 0418) BP: (133-149)/(62-83) 149/69 (10/26 0418) SpO2:  [98 %-100 %] 99 % (10/26 0418) Last BM Date: 12/15/17 General:   Alert, well-developed male in NAD EENT:  Normal hearing, non icteric sclera Heart:  Regular rate and rhythm; no murmurs. No lower extremity edema Pulm: Normal respiratory effort Abdomen:  Soft, nondistended, nontender.  Normal bowel sounds, no masses felt.     Neurologic:  Alert and  oriented  x4;  grossly normal neurologically. Psych:  cooperative.  Normal mood and affect.   Intake/Output from previous day: 10/25 0701 - 10/26 0700 In: 2484.9 [P.O.:600; I.V.:1236.9; Blood:648] Out: 100 [Urine:100] Intake/Output this shift: No intake/output data recorded.  Lab Results: Recent Labs    12/14/17 1102 12/15/17 0501 12/16/17 0626  WBC 15.0* 7.7 8.0  HGB 8.1* 6.8* 8.5*  HCT 27.3* 22.4* 27.1*  PLT 341 290 243   BMET Recent Labs    12/14/17 1102 12/15/17 0501  NA 141 140  K 4.3 4.5  CL 110 111  CO2 19* 22  GLUCOSE 177* 132*  BUN 17 13  CREATININE 1.03 0.98  CALCIUM 9.5 9.0    PT/INR Recent Labs    12/15/17 0501  LABPROT 15.0  INR 1.19       LOS: 2 days   Tye Savoy ,NP 12/16/2017, 10:54 AM

## 2017-12-16 NOTE — Progress Notes (Signed)
Blood transfusion completed with no adverse reactions noted.

## 2017-12-17 ENCOUNTER — Other Ambulatory Visit: Payer: Self-pay | Admitting: Internal Medicine

## 2017-12-17 ENCOUNTER — Other Ambulatory Visit: Payer: Self-pay | Admitting: Nurse Practitioner

## 2017-12-17 DIAGNOSIS — K922 Gastrointestinal hemorrhage, unspecified: Secondary | ICD-10-CM

## 2017-12-17 LAB — TYPE AND SCREEN
ABO/RH(D): O POS
ANTIBODY SCREEN: NEGATIVE
UNIT DIVISION: 0
UNIT DIVISION: 0

## 2017-12-17 LAB — BPAM RBC
Blood Product Expiration Date: 201911212359
Blood Product Expiration Date: 201911212359
ISSUE DATE / TIME: 201910251312
ISSUE DATE / TIME: 201910260113
UNIT TYPE AND RH: 5100
Unit Type and Rh: 5100

## 2017-12-17 LAB — CBC
HEMATOCRIT: 27.8 % — AB (ref 39.0–52.0)
Hemoglobin: 8.4 g/dL — ABNORMAL LOW (ref 13.0–17.0)
MCH: 24.9 pg — AB (ref 26.0–34.0)
MCHC: 30.2 g/dL (ref 30.0–36.0)
MCV: 82.5 fL (ref 80.0–100.0)
Platelets: 251 10*3/uL (ref 150–400)
RBC: 3.37 MIL/uL — ABNORMAL LOW (ref 4.22–5.81)
RDW: 18.1 % — AB (ref 11.5–15.5)
WBC: 7.7 10*3/uL (ref 4.0–10.5)
nRBC: 0 % (ref 0.0–0.2)

## 2017-12-17 LAB — BASIC METABOLIC PANEL
Anion gap: 6 (ref 5–15)
BUN: 7 mg/dL — AB (ref 8–23)
CALCIUM: 8.9 mg/dL (ref 8.9–10.3)
CHLORIDE: 109 mmol/L (ref 98–111)
CO2: 22 mmol/L (ref 22–32)
CREATININE: 0.98 mg/dL (ref 0.61–1.24)
GFR calc Af Amer: >60 mL/min (ref >60)
GFR calc non Af Amer: >60 mL/min (ref >60)
GLUCOSE: 103 mg/dL — AB (ref 70–99)
Potassium: 3.9 mmol/L (ref 3.5–5.1)
Sodium: 137 mmol/L (ref 135–145)

## 2017-12-17 NOTE — Progress Notes (Signed)
Subjective:  Patient denies any chest pain or shortness of breath.states has been on clear liquid diet only.  Objective:  Vital Signs in the last 24 hours: Temp:  [98.7 F (37.1 C)-99.1 F (37.3 C)] 98.8 F (37.1 C) (10/27 0605) Pulse Rate:  [55-62] 62 (10/27 0919) Resp:  [18-26] 18 (10/26 2101) BP: (140-158)/(61-77) 144/61 (10/27 0605) SpO2:  [98 %-100 %] 98 % (10/27 0605) Weight:  [71.6 kg] 71.6 kg (10/27 0500)  Intake/Output from previous day: 10/26 0701 - 10/27 0700 In: 1198.3 [I.V.:1198.3] Out: -  Intake/Output from this shift: Total I/O In: -  Out: 200 [Urine:200]  Physical Exam: Neck: no adenopathy, no carotid bruit, no JVD and supple, symmetrical, trachea midline Lungs: clear to auscultation bilaterally Heart: regular rate and rhythm, S1, S2 normal and 2/6 systolic murmur noted Abdomen: soft, non-tender; bowel sounds normal; no masses,  no organomegaly Extremities: extremities normal, atraumatic, no cyanosis or edema  Lab Results: Recent Labs    12/16/17 0626 12/17/17 0410  WBC 8.0 7.7  HGB 8.5* 8.4*  PLT 243 251   Recent Labs    12/15/17 0501 12/17/17 0410  NA 140 137  K 4.5 3.9  CL 111 109  CO2 22 22  GLUCOSE 132* 103*  BUN 13 7*  CREATININE 0.98 0.98   No results for input(s): TROPONINI in the last 72 hours.  Invalid input(s): CK, MB Hepatic Function Panel No results for input(s): PROT, ALBUMIN, AST, ALT, ALKPHOS, BILITOT, BILIDIR, IBILI in the last 72 hours. No results for input(s): CHOL in the last 72 hours. No results for input(s): PROTIME in the last 72 hours.  Imaging: Imaging results have been reviewed and No results found.  Cardiac Studies:  Assessment/Plan:  Status post acute lower GI bleed Possible diverticular bleed Type 2 DM CAD S/P stent in LAD Hypertension Anemia, acute blood loss and iron deficiency Plan Advance diet as tolerated Check labs in a.m. If stable will discharge home if okay with GI  LOS: 3 days     Charolette Forward 12/17/2017, 10:07 AM

## 2017-12-17 NOTE — Discharge Instructions (Signed)
You need follow up labs. Please go to basement of [facility] across from [place] on 12/25/17 anytime between 8am and 4pm.

## 2017-12-17 NOTE — Progress Notes (Addendum)
     [facility] Progress Note   Chief Complaint:  Rectal bleeding     SUBJECTIVE:    no complaints. He had two normal (without blood) BMs yesterday and another this am   ASSESSMENT AND PLAN:   1. Painless hematochezia on ASA/ plavix. Resolved. Diverticulosis on colonoscopy Dec 2018. Suspect current bleeding is diverticular.  -Plavix can be resumed in one week. May resume ASA tomorrow.  -He will need repeat CBC at our office (in basement ) on 11/4. I will ask my nurse to give him a reminder call early next week.   2. Acute on chronic anemia.  -Hgb stable at 8.4 post 2 units of pRBCs this admit    [facility] Attending   I have taken an interval history, reviewed the chart and examined the patient. I agree with the Advanced Practitioner's note, impression and recommendations.     Bryan Mayer, MD, [facility] 12/17/2017 2:58 PM Pager [phone]   OBJECTIVE:     Vital signs in last 24 hours: Temp:  [98.7 F (37.1 C)-99.1 F (37.3 C)] 98.8 F (37.1 C) (10/27 0605) Pulse Rate:  [55-62] 62 (10/27 0919) Resp:  [18-26] 18 (10/26 2101) BP: (140-158)/(61-77) 144/61 (10/27 0605) SpO2:  [98 %-100 %] 98 % (10/27 0605) Weight:  [71.6 kg] 71.6 kg (10/27 0500) Last BM Date: 12/16/17 General:   Alert, well-developed male in NAD Heart:  Regular rate and rhythm; no lower extremity edema Pulm: Normal respiratory effort Abdomen:  Soft, nondistended, nontender.  Normal bowel sounds, no masses felt.     Neurologic:  Alert and  oriented x4;  grossly normal neurologically. Psych:  Pleasant, cooperative.  Normal mood and affect.    Lab Results: Recent Labs    12/15/17 0501 12/16/17 0626 12/17/17 0410  WBC 7.7 8.0 7.7  HGB 6.8* 8.5* 8.4*  HCT 22.4* 27.1* 27.8*  PLT 290 243 251      LOS: 3 days   Bryan Roy ,NP 12/17/2017, 10:29 AM

## 2017-12-18 ENCOUNTER — Telehealth: Payer: Self-pay

## 2017-12-18 LAB — CBC
HEMATOCRIT: 28.5 % — AB (ref 39.0–52.0)
HEMOGLOBIN: 8.6 g/dL — AB (ref 13.0–17.0)
MCH: 24.8 pg — ABNORMAL LOW (ref 26.0–34.0)
MCHC: 30.2 g/dL (ref 30.0–36.0)
MCV: 82.1 fL (ref 80.0–100.0)
NRBC: 0 % (ref 0.0–0.2)
Platelets: 257 10*3/uL (ref 150–400)
RBC: 3.47 MIL/uL — ABNORMAL LOW (ref 4.22–5.81)
RDW: 18.3 % — AB (ref 11.5–15.5)
WBC: 9.2 10*3/uL (ref 4.0–10.5)

## 2017-12-18 LAB — HEMOGLOBIN A1C
HEMOGLOBIN A1C: 6.2 % — AB (ref 4.8–5.6)
MEAN PLASMA GLUCOSE: 131.24 mg/dL

## 2017-12-18 MED ORDER — ASPIRIN EC 81 MG PO TBEC
81.0000 mg | DELAYED_RELEASE_TABLET | Freq: Every day | ORAL | Status: DC
  Administered 2017-12-18: 81 mg via ORAL
  Filled 2017-12-18: qty 1

## 2017-12-18 MED ORDER — METFORMIN HCL 500 MG PO TABS: 500.0000 mg | ORAL_TABLET | Freq: Every day | ORAL | Status: AC

## 2017-12-18 NOTE — Telephone Encounter (Signed)
-----   Message from Willia Craze, NP sent at 12/17/2017 11:22 AM EDT ----- Bryan Roy, please call him early next week and remind him about need for CBC on 11/4. Orders are in. Thanks

## 2017-12-18 NOTE — Telephone Encounter (Signed)
Appointment letter mailed today.  Patient discharged today. Unable to reach by phone. 12/25/17 is Monday.

## 2017-12-18 NOTE — Discharge Summary (Signed)
Physician Discharge Summary  Patient ID: Bryan Roy MRN: [id number] DOB/AGE: Dec 21, 1945 72 y.o.  Admit date: 12/14/2017 Discharge date: 12/18/2017  Admission Diagnoses: Dizziness Acute lower GI bleed Type 2 DM S/P stent in LAD Hypertension Chronic iron deficiency anemia  Discharge Diagnoses:  Principle Problem: *Acute lower GI bleed* Active Problems:   Painless hematochezia   Anemia, acute on chronic   Anemia of blood loss   Anemia of iron deficiency   Type 2 DM   CAD   S/P stent in LAD   Hypertension  Discharged Condition: stable  Hospital Course: 72 year old male with PMH of CAD, LAD stent, iron deficiency anemia, hypertension, type 2 DM had dizziness and near syncopal episode. He notoced bright red blood per rectum. He denied abdominal pain, chest pain, nausea or vomiting. His Hgb was down to 6.8 gm. He responded to holding aspirin and Plavix. He received 2 units of PRBC with stabilization of Hgb level. GI consult was obtained and diverticular bleed was suspected. He was discharged home in stable condition with PPI and aspirin and holding some of the BP lowering medications along with Plavix.  He will see me in 1 week and  GI/Dr. Lorayne Bender in 1 week.  Consults: cardiology and GI  Significant Diagnostic Studies: labs: Hgb 8.1 to 6.8 g in 24 hours. Normal WBC and Platelets count. BMET near normal except blood glucose of 177 mg. Iron level improved to 66 Mcg. from 13 mcg 10 months ago.  EKG-NSR, possible V2 and V3 lead switch.  Treatments: PRBC transfusion and IV fluids.  Discharge Exam: Blood pressure 124/70, pulse (!) 52, temperature 98 F (36.7 C), resp. rate 20, weight 71.6 kg, SpO2 98 %. General appearance: alert, cooperative and appears stated age. Head: Normocephalic, atraumatic. Eyes: Brown eyes, pale conjunctiva, corneas clear. PERRL, EOM's intact.  Neck: No adenopathy, no carotid bruit, no JVD, supple, symmetrical, trachea midline and thyroid not  enlarged. Resp: Clear to auscultation bilaterally. Cardio: Regular rate and rhythm, S1, S2 normal, II/VI systolic murmur, no click, rub or gallop. GI: Soft, non-tender; bowel sounds normal; no organomegaly. Extremities: No edema, cyanosis or clubbing. Skin: Warm and dry.  Neurologic: Alert and oriented X 3, normal strength and tone. Normal coordination and gait.  Disposition: Discharge disposition: 01-Home or Self Care        Allergies as of 12/18/2017   No Known Allergies     Medication List    STOP taking these medications   amLODipine 5 MG tablet Commonly known as:  NORVASC   clopidogrel 75 MG tablet Commonly known as:  PLAVIX   hydrALAZINE 25 MG tablet Commonly known as:  APRESOLINE     TAKE these medications   aspirin 81 MG EC tablet Take 1 tablet (81 mg total) by mouth daily.   atorvastatin 40 MG tablet Commonly known as:  LIPITOR Take 1 tablet (40 mg total) by mouth daily at 6 PM.   ferrous sulfate 325 (65 FE) MG EC tablet Take 1 tablet (325 mg total) by mouth 2 (two) times daily.   HYDROcodone-acetaminophen 5-325 MG tablet Commonly known as:  NORCO/VICODIN Take 1 tablet by mouth daily.   losartan 100 MG tablet Commonly known as:  COZAAR Take 100 mg by mouth daily.   metFORMIN 500 MG tablet Commonly known as:  GLUCOPHAGE Take 1 tablet (500 mg total) by mouth daily with breakfast.   metoprolol tartrate 25 MG tablet Commonly known as:  LOPRESSOR Take 12.5 mg by mouth 2 (two) times daily.  nitroGLYCERIN 0.4 MG SL tablet Commonly known as:  NITROSTAT Place 1 tablet (0.4 mg total) under the tongue every 5 (five) minutes x 3 doses as needed for chest pain.   pantoprazole 40 MG tablet Commonly known as:  PROTONIX Take 40 mg by mouth See admin instructions. Take one tablet (40mg ) by mouth prior to taking a Nitro.      Follow-up Information    Dixie Dials, MD. Call in 1 week(s).   Specialty:  Cardiology Contact information: [place] [address] [phone]           Signed: Birdie Riddle 12/18/2017, 9:47 AM

## 2017-12-18 NOTE — Progress Notes (Signed)
Pt given discharge instructions, prescriptions, and care notes. Pt verbalized understanding AEB no further questions or concerns at this time. IV was discontinued, no redness, pain, or swelling noted at this time. Telemetry discontinued and Centralized Telemetry was notified. Pt left the floor via wheelchair with staff in stable condition. 

## 2017-12-18 NOTE — Care Management Important Message (Signed)
Important Message  Patient Details  Name: Bryan Roy MRN: [id number] Date of Birth: 09/16/1945   Medicare Important Message Given:  Yes    Orbie Pyo 12/18/2017, 4:26 PM

## 2017-12-18 NOTE — Telephone Encounter (Signed)
Appointment is scheduled for 12/27/17 at 9:00 am.

## 2017-12-19 ENCOUNTER — Other Ambulatory Visit: Payer: Self-pay | Admitting: *Deleted

## 2017-12-19 NOTE — Telephone Encounter (Signed)
Spoke with the sister. The patient "went to work today." She will relay the appointment information and try to arrange a ride for the patient. Aware of the need for the repeat labs. Understands this information was omitted on the discharge papers. She has the phone number here and will call with any questions or concerns.

## 2017-12-19 NOTE — Patient Outreach (Signed)
[facility]) Care Management  12/19/2017  Bryan Roy 1945-06-23 [id number]   [facility]) Care Management  12/15/2017  Bryan Roy Nov 15, 1945 [id number]   Telephone Screen  Referral Date: 12/15/17 (CM received this referral on 12/15/17)  Referral Source: MD referral Elease Hashimoto)  Referral Reason: Medication compliance; chronic disease management  Insurance: [place] admission x 2  ED visit x 2  in the last 6 months    [facility] RN CM collaborated with [place] and hospital liaison and [place] about this patient  It has been confirmed by [name]  (he is "at work" and referred Cm to his sister) and his sister, [name] that [name] does not have a [facility] participating primary care MD nor any primary MD at all and has only been seen by Dr Doylene Canard, cardiologist in the last 3-6 months.  CM discussed with [name] that at this time [name] does not meet the program criteria and she voiced understanding. She and CM discussed the importance of a primary MD. She voices that she had a MD in mind but is not sur [name] would agree to see the primary MD   Plan   [facility] RN CM will close case at this time as patient does not meet program criteria  Joelene Millin L. Lavina Hamman, RN, BSN, [place] Coordinator Office number [phone] Mobile number [phone]  Main [facility] number [phone] Fax number [phone]

## 2017-12-26 DIAGNOSIS — Z9861 Coronary angioplasty status: Secondary | ICD-10-CM | POA: Diagnosis not present

## 2017-12-26 DIAGNOSIS — I2510 Atherosclerotic heart disease of native coronary artery without angina pectoris: Secondary | ICD-10-CM | POA: Diagnosis not present

## 2017-12-26 DIAGNOSIS — M545 Low back pain: Secondary | ICD-10-CM | POA: Diagnosis not present

## 2017-12-26 DIAGNOSIS — I10 Essential (primary) hypertension: Secondary | ICD-10-CM | POA: Diagnosis not present

## 2017-12-26 DIAGNOSIS — E1140 Type 2 diabetes mellitus with diabetic neuropathy, unspecified: Secondary | ICD-10-CM | POA: Diagnosis not present

## 2017-12-27 ENCOUNTER — Ambulatory Visit: Payer: Medicare HMO | Admitting: Nurse Practitioner

## 2017-12-27 ENCOUNTER — Encounter: Payer: Self-pay | Admitting: Nurse Practitioner

## 2017-12-27 ENCOUNTER — Other Ambulatory Visit (INDEPENDENT_AMBULATORY_CARE_PROVIDER_SITE_OTHER): Payer: Medicare HMO

## 2017-12-27 VITALS — BP 164/82 | HR 50 | Ht 64.0 in | Wt 156.0 lb

## 2017-12-27 DIAGNOSIS — D649 Anemia, unspecified: Secondary | ICD-10-CM

## 2017-12-27 DIAGNOSIS — Z8719 Personal history of other diseases of the digestive system: Secondary | ICD-10-CM | POA: Diagnosis not present

## 2017-12-27 LAB — CBC
HCT: 31.9 % — ABNORMAL LOW (ref 39.0–52.0)
Hemoglobin: 10.3 g/dL — ABNORMAL LOW (ref 13.0–17.0)
MCHC: 32.4 g/dL (ref 30.0–36.0)
MCV: 83.2 fl (ref 78.0–100.0)
PLATELETS: 288 10*3/uL (ref 150.0–400.0)
RBC: 3.83 Mil/uL — AB (ref 4.22–5.81)
RDW: 20.7 % — ABNORMAL HIGH (ref 11.5–15.5)
WBC: 6.6 10*3/uL (ref 4.0–10.5)

## 2017-12-27 NOTE — Progress Notes (Signed)
Chief Complaint:    Hospital follow-up  IMPRESSION and PLAN:     74.  72 year old male here for hospital follow-up.  He was recently hospitalized with symptomatic anemia /  painless hematochezia, presumably diverticular hemorrhage on plavix and asa.  -He had a colonoscopy December 2018 ( for IDA) with findings of diverticulosis, internal hemorrhoids and a lipoma.  Colonoscopy not repeated during recent admission.  -back on plavix and asa. No further GI bleeding  2. Recurrent anemia associated with recent lower GI bleeding. Hgb 6.8, up to 8.4 post 2 u pRBC.  -He is still iron deficient but not sure patient was taking iron at home as prescribed following December 2018 admission.  He never did undergo Endo capsule study for evaluation of iron deficiency anemia as recommended in  December 2018.   -He is taking ferrous sulfate now.  -Repeat CBC today  HPI:     Patient is a 72 year old male with history of CAD/stent placement August 2018 after which he was placed on Brilinta and aspirin. We saw patient in the hospital December 2018 for evaluation of symptomatic iron deficiency anemia.  His hemoglobin at that time was 6.7, down from 11.8 in August 2018.  Looking back at the records it does not appear that patient was having overt GI bleeding at the time.  Inpatient endoscopic workup reviewed mild reflux esophagitis and non-bleeding erosive gastropathy, probably from ASA. Inpatient colonoscopy pertinent for hemorrhoids, diverticulosis and a lipoma. He was transfused 2 units of blood, hemoglobin rose appropriately to 8.9.  Small bowel capsule study recommended but apparently patient or family declined  Patient represented to the hospital late October with symptomatic IDA, this time with painless hematochezia on plavix and asa.  Plavix held, bleeding resolved spontaneously.  Repeat colonoscopy not done. Presenting hgb 8.1, nadir of 6.8.  He received 2 units of pRBC and hemoglobin rose  appropriately to around 8.4.  Plavix and aspirin were restarted at time of discharge.  Patient is here for hospital follow-up.  He has had no further overt GI bleeding.  No abdominal pain.  He feels fine without any dizziness or shortness of breath.  He has not yet had a post hospital's follow-up CBC.  He is taking ferrous sulfate   Review of systems:     No chest pain, no SOB, no fevers, no urinary sx   Past Medical History:  Diagnosis Date  . Anemia 01/2017  . Coronary artery disease   . DM (diabetes mellitus) ([place])   . Dyspnea     Patient's surgical history, family medical history, social history, medications and allergies were all reviewed in Epic   Serum creatinine: 0.98 mg/dL 12/17/17 0410 Estimated creatinine clearance: 56.2 mL/min  Current Outpatient Medications  Medication Sig Dispense Refill  . aspirin EC 81 MG EC tablet Take 1 tablet (81 mg total) by mouth daily.    Marland Kitchen atorvastatin (LIPITOR) 40 MG tablet Take 1 tablet (40 mg total) by mouth daily at 6 PM. 30 tablet 6  . ferrous sulfate 325 (65 FE) MG EC tablet Take 1 tablet (325 mg total) by mouth 2 (two) times daily. 60 tablet 3  . HYDROcodone-acetaminophen (NORCO/VICODIN) 5-325 MG tablet Take 1 tablet by mouth daily.    Marland Kitchen losartan (COZAAR) 100 MG tablet Take 100 mg by mouth daily.    . metFORMIN (GLUCOPHAGE) 500 MG tablet Take 1 tablet (500 mg total) by mouth daily with breakfast.    . metoprolol tartrate (LOPRESSOR) 25  MG tablet Take 12.5 mg by mouth 2 (two) times daily.     . nitroGLYCERIN (NITROSTAT) 0.4 MG SL tablet Place 1 tablet (0.4 mg total) under the tongue every 5 (five) minutes x 3 doses as needed for chest pain. 25 tablet 1  . pantoprazole (PROTONIX) 40 MG tablet Take 40 mg by mouth See admin instructions. Take one tablet (40mg ) by mouth prior to taking a Nitro.     No current facility-administered medications for this visit.     Physical Exam:     BP (!) 164/82   Pulse (!) 50   Ht 5\' 4"  (1.626 m)   Wt  156 lb (70.8 kg)   BMI 26.78 kg/m   GENERAL:  Pleasant male in NAD PSYCH: : Cooperative, normal affect EENT:  conjunctiva pink, mucous membranes moist, neck supple without masses CARDIAC:  RRR, nomurmur heard, no peripheral edema PULM: Normal respiratory effort, lungs CTA bilaterally, no wheezing ABDOMEN:  Nondistended, soft, nontender. No obvious masses, no hepatomegaly,  normal bowel sounds SKIN:  turgor, no lesions seen Musculoskeletal:  Normal muscle tone, normal strength NEURO: Alert and oriented x 3, no focal neurologic deficits   Tye Savoy , NP 12/27/2017, 9:19 AM

## 2017-12-27 NOTE — Patient Instructions (Signed)
If you are age 73 or older, your body mass index should be between 23-30. Your Body mass index is 26.78 kg/m. If this is out of the aforementioned range listed, please consider follow up with your Primary Care Provider.  If you are age 36 or younger, your body mass index should be between 19-25. Your Body mass index is 26.78 kg/m. If this is out of the aformentioned range listed, please consider follow up with your Primary Care Provider.   Your provider has requested that you go to the basement level for lab work before leaving today. Press "B" on the elevator. The lab is located at the first door on the left as you exit the elevator. CBC  We will call with results.  Thank you for choosing me and [facility].   Tye Savoy, NP

## 2018-02-28 DIAGNOSIS — E1140 Type 2 diabetes mellitus with diabetic neuropathy, unspecified: Secondary | ICD-10-CM | POA: Diagnosis not present

## 2018-02-28 DIAGNOSIS — Z9861 Coronary angioplasty status: Secondary | ICD-10-CM | POA: Diagnosis not present

## 2018-02-28 DIAGNOSIS — I10 Essential (primary) hypertension: Secondary | ICD-10-CM | POA: Diagnosis not present

## 2018-02-28 DIAGNOSIS — D500 Iron deficiency anemia secondary to blood loss (chronic): Secondary | ICD-10-CM | POA: Diagnosis not present

## 2018-02-28 DIAGNOSIS — I2510 Atherosclerotic heart disease of native coronary artery without angina pectoris: Secondary | ICD-10-CM | POA: Diagnosis not present

## 2018-05-01 DIAGNOSIS — I2510 Atherosclerotic heart disease of native coronary artery without angina pectoris: Secondary | ICD-10-CM | POA: Diagnosis not present

## 2018-05-01 DIAGNOSIS — I10 Essential (primary) hypertension: Secondary | ICD-10-CM | POA: Diagnosis not present

## 2018-05-01 DIAGNOSIS — Z9861 Coronary angioplasty status: Secondary | ICD-10-CM | POA: Diagnosis not present

## 2018-05-01 DIAGNOSIS — E1140 Type 2 diabetes mellitus with diabetic neuropathy, unspecified: Secondary | ICD-10-CM | POA: Diagnosis not present

## 2018-05-31 DIAGNOSIS — I10 Essential (primary) hypertension: Secondary | ICD-10-CM | POA: Diagnosis not present

## 2018-05-31 DIAGNOSIS — E1140 Type 2 diabetes mellitus with diabetic neuropathy, unspecified: Secondary | ICD-10-CM | POA: Diagnosis not present

## 2018-05-31 DIAGNOSIS — M545 Low back pain: Secondary | ICD-10-CM | POA: Diagnosis not present

## 2018-05-31 DIAGNOSIS — Z9861 Coronary angioplasty status: Secondary | ICD-10-CM | POA: Diagnosis not present

## 2018-06-13 DIAGNOSIS — Z79899 Other long term (current) drug therapy: Secondary | ICD-10-CM | POA: Diagnosis not present

## 2018-06-13 DIAGNOSIS — E785 Hyperlipidemia, unspecified: Secondary | ICD-10-CM | POA: Diagnosis not present

## 2018-06-13 DIAGNOSIS — N429 Disorder of prostate, unspecified: Secondary | ICD-10-CM | POA: Diagnosis not present

## 2018-06-13 DIAGNOSIS — E1165 Type 2 diabetes mellitus with hyperglycemia: Secondary | ICD-10-CM | POA: Diagnosis not present

## 2018-06-13 DIAGNOSIS — E559 Vitamin D deficiency, unspecified: Secondary | ICD-10-CM | POA: Diagnosis not present

## 2018-06-13 DIAGNOSIS — E876 Hypokalemia: Secondary | ICD-10-CM | POA: Diagnosis not present

## 2018-06-13 DIAGNOSIS — D649 Anemia, unspecified: Secondary | ICD-10-CM | POA: Diagnosis not present

## 2018-06-27 DIAGNOSIS — E1140 Type 2 diabetes mellitus with diabetic neuropathy, unspecified: Secondary | ICD-10-CM | POA: Diagnosis not present

## 2018-06-27 DIAGNOSIS — Z9861 Coronary angioplasty status: Secondary | ICD-10-CM | POA: Diagnosis not present

## 2018-06-27 DIAGNOSIS — I10 Essential (primary) hypertension: Secondary | ICD-10-CM | POA: Diagnosis not present

## 2018-06-27 DIAGNOSIS — M545 Low back pain: Secondary | ICD-10-CM | POA: Diagnosis not present

## 2018-07-09 DIAGNOSIS — E1140 Type 2 diabetes mellitus with diabetic neuropathy, unspecified: Secondary | ICD-10-CM | POA: Diagnosis not present

## 2018-07-09 DIAGNOSIS — M545 Low back pain: Secondary | ICD-10-CM | POA: Diagnosis not present

## 2018-07-09 DIAGNOSIS — I2510 Atherosclerotic heart disease of native coronary artery without angina pectoris: Secondary | ICD-10-CM | POA: Diagnosis not present

## 2018-07-09 DIAGNOSIS — I10 Essential (primary) hypertension: Secondary | ICD-10-CM | POA: Diagnosis not present

## 2018-08-09 DIAGNOSIS — I2510 Atherosclerotic heart disease of native coronary artery without angina pectoris: Secondary | ICD-10-CM | POA: Diagnosis not present

## 2018-08-09 DIAGNOSIS — I10 Essential (primary) hypertension: Secondary | ICD-10-CM | POA: Diagnosis not present

## 2018-08-09 DIAGNOSIS — E1140 Type 2 diabetes mellitus with diabetic neuropathy, unspecified: Secondary | ICD-10-CM | POA: Diagnosis not present

## 2018-08-09 DIAGNOSIS — M545 Low back pain: Secondary | ICD-10-CM | POA: Diagnosis not present

## 2018-09-06 DIAGNOSIS — E1140 Type 2 diabetes mellitus with diabetic neuropathy, unspecified: Secondary | ICD-10-CM | POA: Diagnosis not present

## 2018-09-06 DIAGNOSIS — I10 Essential (primary) hypertension: Secondary | ICD-10-CM | POA: Diagnosis not present

## 2018-09-06 DIAGNOSIS — I2510 Atherosclerotic heart disease of native coronary artery without angina pectoris: Secondary | ICD-10-CM | POA: Diagnosis not present

## 2018-09-06 DIAGNOSIS — Z9861 Coronary angioplasty status: Secondary | ICD-10-CM | POA: Diagnosis not present

## 2018-10-09 DIAGNOSIS — Z9861 Coronary angioplasty status: Secondary | ICD-10-CM | POA: Diagnosis not present

## 2018-10-09 DIAGNOSIS — E1140 Type 2 diabetes mellitus with diabetic neuropathy, unspecified: Secondary | ICD-10-CM | POA: Diagnosis not present

## 2018-10-09 DIAGNOSIS — I10 Essential (primary) hypertension: Secondary | ICD-10-CM | POA: Diagnosis not present

## 2018-10-09 DIAGNOSIS — D500 Iron deficiency anemia secondary to blood loss (chronic): Secondary | ICD-10-CM | POA: Diagnosis not present

## 2018-10-25 DIAGNOSIS — N429 Disorder of prostate, unspecified: Secondary | ICD-10-CM | POA: Diagnosis not present

## 2018-10-25 DIAGNOSIS — D649 Anemia, unspecified: Secondary | ICD-10-CM | POA: Diagnosis not present

## 2018-10-25 DIAGNOSIS — Z79899 Other long term (current) drug therapy: Secondary | ICD-10-CM | POA: Diagnosis not present

## 2018-10-25 DIAGNOSIS — E1165 Type 2 diabetes mellitus with hyperglycemia: Secondary | ICD-10-CM | POA: Diagnosis not present

## 2018-10-25 DIAGNOSIS — E876 Hypokalemia: Secondary | ICD-10-CM | POA: Diagnosis not present

## 2018-11-13 DIAGNOSIS — M545 Low back pain: Secondary | ICD-10-CM | POA: Diagnosis not present

## 2018-11-13 DIAGNOSIS — Z9861 Coronary angioplasty status: Secondary | ICD-10-CM | POA: Diagnosis not present

## 2018-11-13 DIAGNOSIS — I2510 Atherosclerotic heart disease of native coronary artery without angina pectoris: Secondary | ICD-10-CM | POA: Diagnosis not present

## 2018-11-13 DIAGNOSIS — E1140 Type 2 diabetes mellitus with diabetic neuropathy, unspecified: Secondary | ICD-10-CM | POA: Diagnosis not present

## 2019-01-10 DIAGNOSIS — Z9861 Coronary angioplasty status: Secondary | ICD-10-CM | POA: Diagnosis not present

## 2019-01-10 DIAGNOSIS — I10 Essential (primary) hypertension: Secondary | ICD-10-CM | POA: Diagnosis not present

## 2019-01-10 DIAGNOSIS — I2510 Atherosclerotic heart disease of native coronary artery without angina pectoris: Secondary | ICD-10-CM | POA: Diagnosis not present

## 2019-01-10 DIAGNOSIS — E1140 Type 2 diabetes mellitus with diabetic neuropathy, unspecified: Secondary | ICD-10-CM | POA: Diagnosis not present

## 2019-03-12 DIAGNOSIS — I10 Essential (primary) hypertension: Secondary | ICD-10-CM | POA: Diagnosis not present

## 2019-03-12 DIAGNOSIS — I2510 Atherosclerotic heart disease of native coronary artery without angina pectoris: Secondary | ICD-10-CM | POA: Diagnosis not present

## 2019-03-12 DIAGNOSIS — E1140 Type 2 diabetes mellitus with diabetic neuropathy, unspecified: Secondary | ICD-10-CM | POA: Diagnosis not present

## 2019-03-12 DIAGNOSIS — Z9861 Coronary angioplasty status: Secondary | ICD-10-CM | POA: Diagnosis not present

## 2019-06-10 DIAGNOSIS — Z9861 Coronary angioplasty status: Secondary | ICD-10-CM | POA: Diagnosis not present

## 2019-06-10 DIAGNOSIS — I10 Essential (primary) hypertension: Secondary | ICD-10-CM | POA: Diagnosis not present

## 2019-06-10 DIAGNOSIS — M545 Low back pain: Secondary | ICD-10-CM | POA: Diagnosis not present

## 2019-06-10 DIAGNOSIS — E1140 Type 2 diabetes mellitus with diabetic neuropathy, unspecified: Secondary | ICD-10-CM | POA: Diagnosis not present

## 2019-06-19 DIAGNOSIS — Z79899 Other long term (current) drug therapy: Secondary | ICD-10-CM | POA: Diagnosis not present

## 2019-06-19 DIAGNOSIS — E876 Hypokalemia: Secondary | ICD-10-CM | POA: Diagnosis not present

## 2019-06-19 DIAGNOSIS — E7849 Other hyperlipidemia: Secondary | ICD-10-CM | POA: Diagnosis not present

## 2019-06-19 DIAGNOSIS — D649 Anemia, unspecified: Secondary | ICD-10-CM | POA: Diagnosis not present

## 2019-06-19 DIAGNOSIS — N429 Disorder of prostate, unspecified: Secondary | ICD-10-CM | POA: Diagnosis not present

## 2019-06-19 DIAGNOSIS — E1165 Type 2 diabetes mellitus with hyperglycemia: Secondary | ICD-10-CM | POA: Diagnosis not present

## 2019-07-10 DIAGNOSIS — M545 Low back pain: Secondary | ICD-10-CM | POA: Diagnosis not present

## 2019-07-10 DIAGNOSIS — D500 Iron deficiency anemia secondary to blood loss (chronic): Secondary | ICD-10-CM | POA: Diagnosis not present

## 2019-07-10 DIAGNOSIS — E1140 Type 2 diabetes mellitus with diabetic neuropathy, unspecified: Secondary | ICD-10-CM | POA: Diagnosis not present

## 2019-07-10 DIAGNOSIS — I10 Essential (primary) hypertension: Secondary | ICD-10-CM | POA: Diagnosis not present

## 2019-08-13 DIAGNOSIS — I2510 Atherosclerotic heart disease of native coronary artery without angina pectoris: Secondary | ICD-10-CM | POA: Diagnosis not present

## 2019-08-13 DIAGNOSIS — Z9861 Coronary angioplasty status: Secondary | ICD-10-CM | POA: Diagnosis not present

## 2019-08-13 DIAGNOSIS — I10 Essential (primary) hypertension: Secondary | ICD-10-CM | POA: Diagnosis not present

## 2019-08-13 DIAGNOSIS — E1140 Type 2 diabetes mellitus with diabetic neuropathy, unspecified: Secondary | ICD-10-CM | POA: Diagnosis not present

## 2019-08-17 IMAGING — CR DG LUMBAR SPINE COMPLETE 4+V
5 series · 5 of 5 positions shown · non-contrast
Comparison: CT abdomen pelvis dated February 10, 2017.

CLINICAL DATA: Low back pain after lifting injury 1 week ago.

EXAM:
LUMBAR SPINE - COMPLETE 4+ VIEW

[t l-spine a.p.]
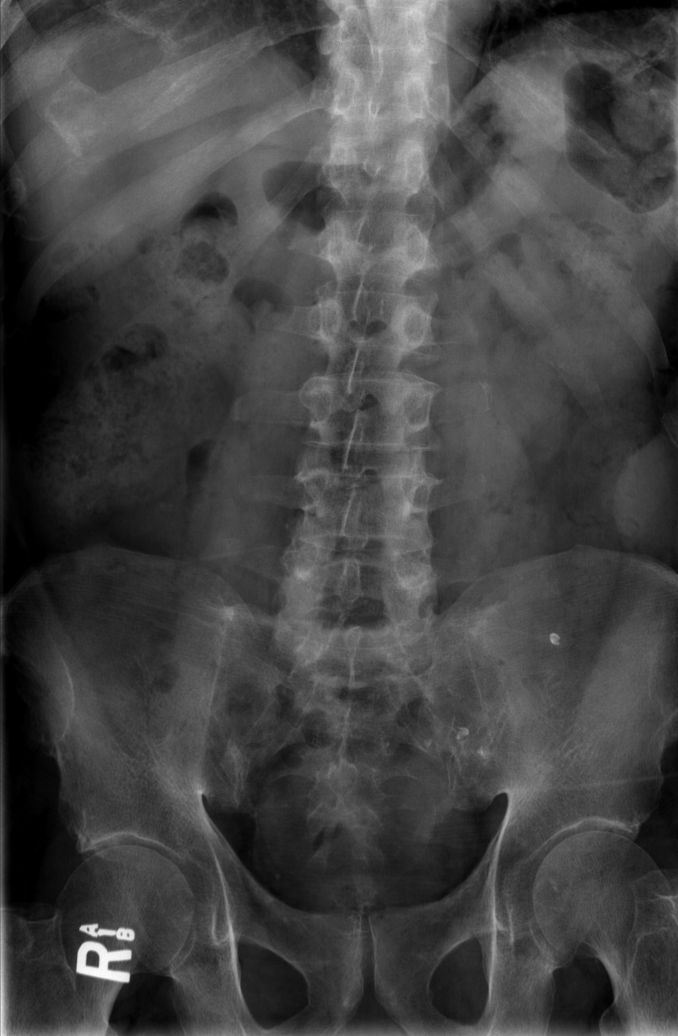

[t l-spine oblique exposure (1 of 2)]
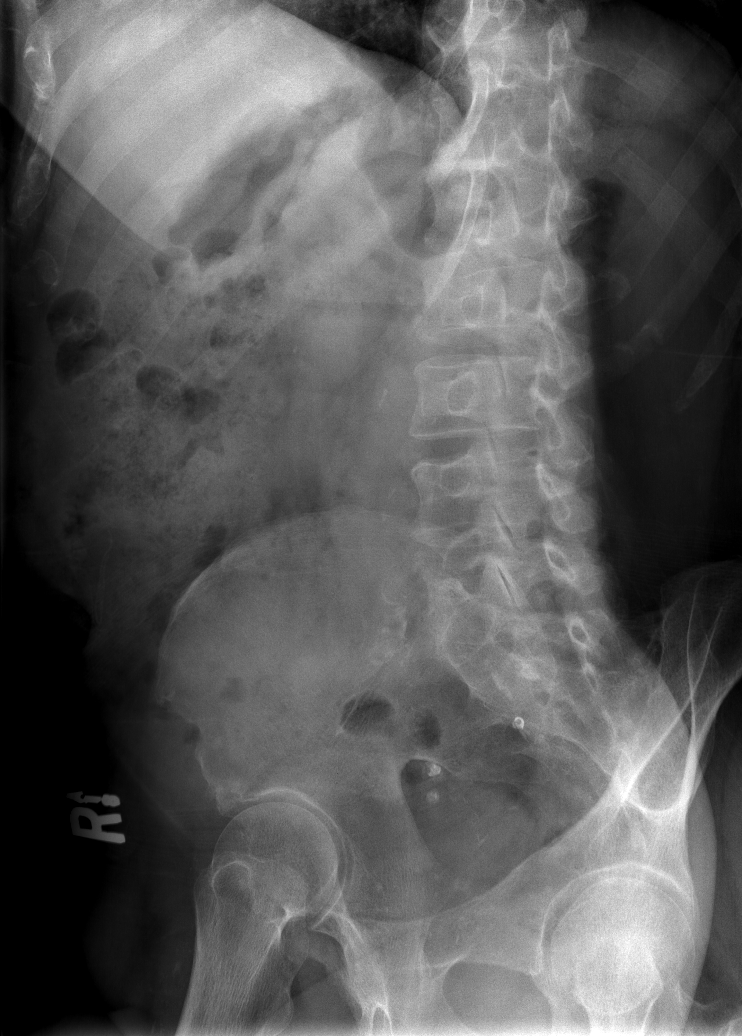

[t l-spine oblique exposure (2 of 2)]
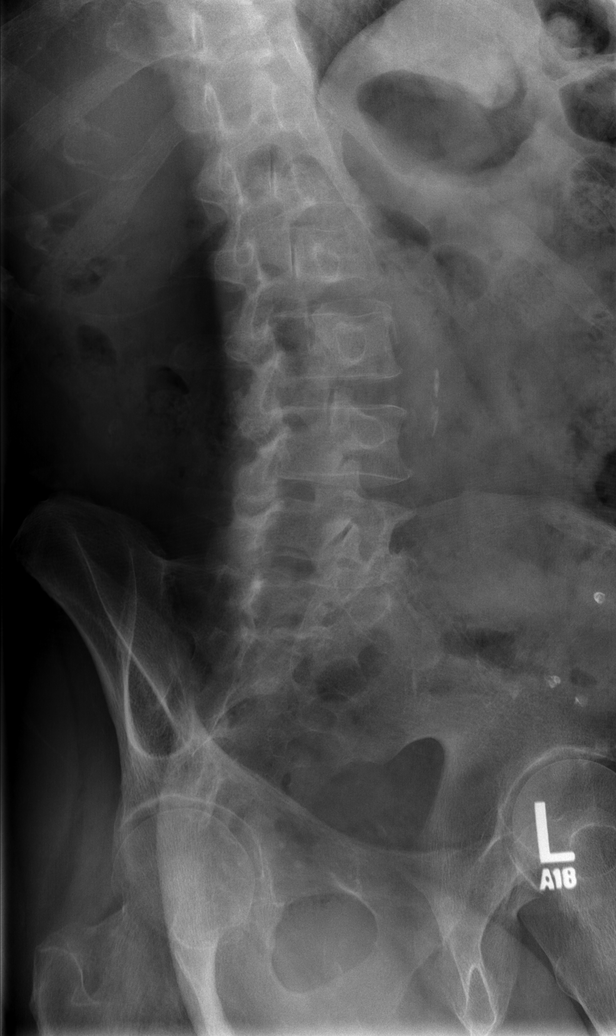

[t l-spine lat]
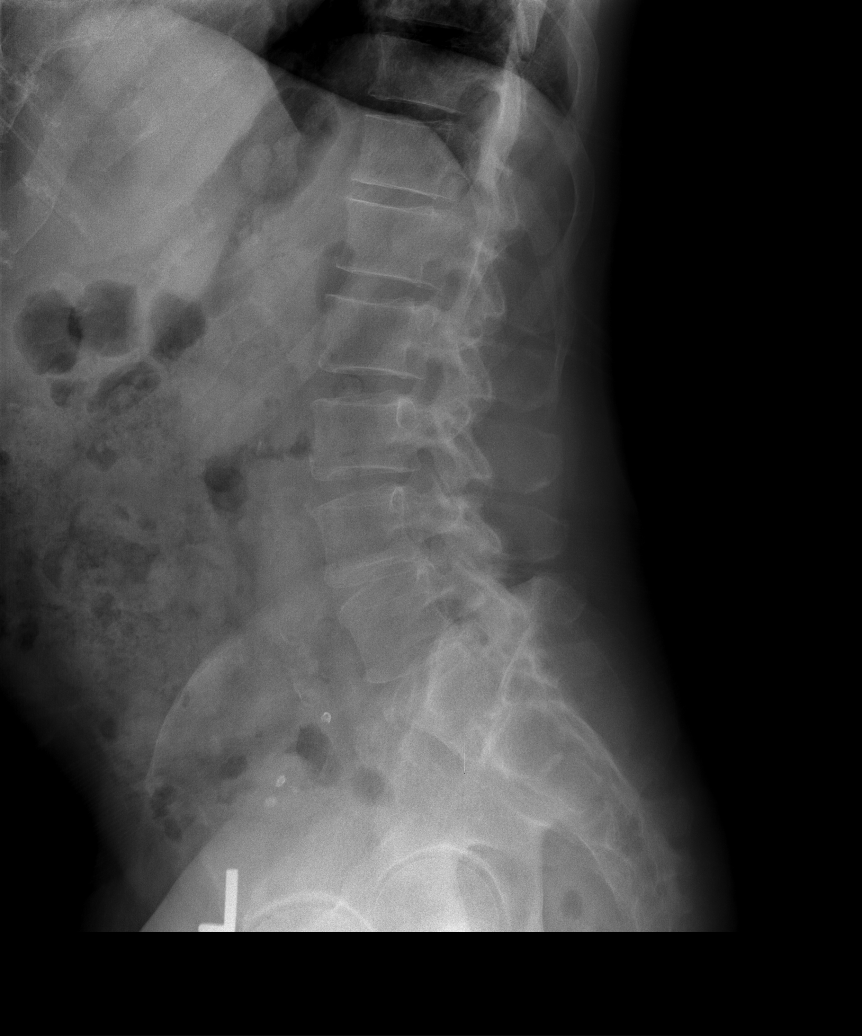

[t l-spine l5-s1 spot]
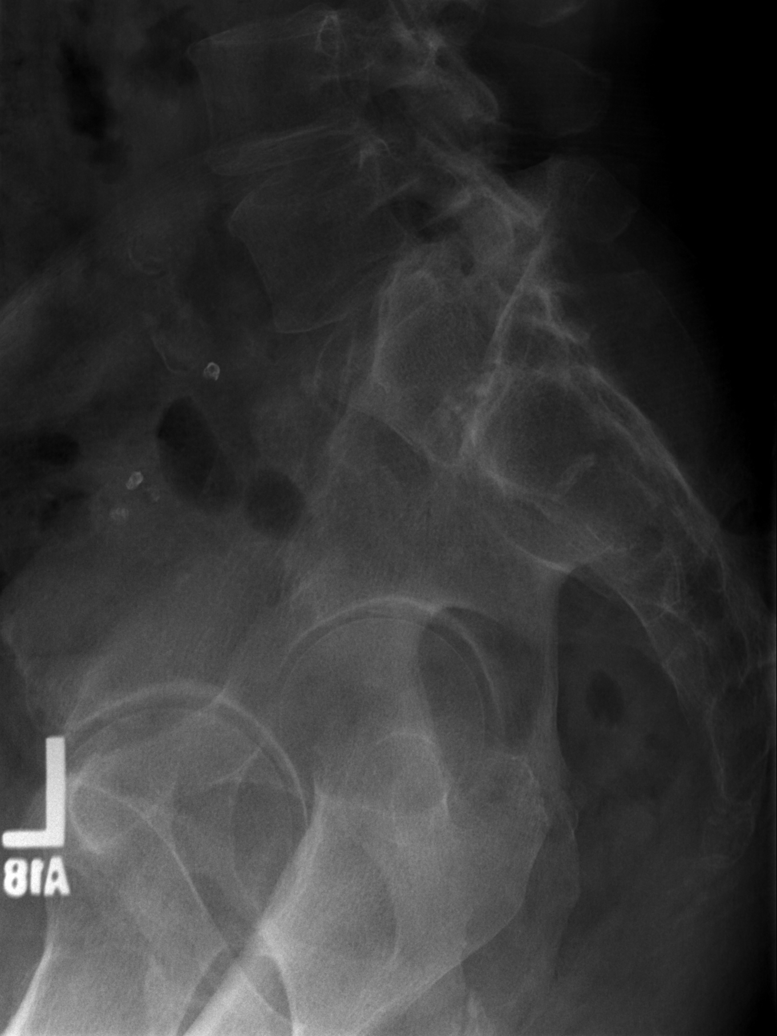

[5 of 5 positions shown; findings below may reference images not displayed]

FINDINGS: Five lumbar type vertebral bodies. No acute fracture or subluxation.
Vertebral body heights are preserved. Alignment is normal. Mild disc
height loss at L4-L5 and L5-S1. Remaining intervertebral disc spaces
are maintained. Mild lower lumbar facet arthropathy. Unchanged
partial ankylosis of the bilateral sacroiliac joints. Osteopenia.
IMPRESSION: 1. No acute osseous abnormality. Mild degenerative changes of the
lower lumbar spine.

## 2019-10-04 DIAGNOSIS — R972 Elevated prostate specific antigen [PSA]: Secondary | ICD-10-CM | POA: Diagnosis not present

## 2019-10-04 DIAGNOSIS — R351 Nocturia: Secondary | ICD-10-CM | POA: Diagnosis not present

## 2019-10-04 DIAGNOSIS — R350 Frequency of micturition: Secondary | ICD-10-CM | POA: Diagnosis not present

## 2019-10-04 DIAGNOSIS — R3914 Feeling of incomplete bladder emptying: Secondary | ICD-10-CM | POA: Diagnosis not present

## 2019-10-04 DIAGNOSIS — N401 Enlarged prostate with lower urinary tract symptoms: Secondary | ICD-10-CM | POA: Diagnosis not present

## 2019-11-13 DIAGNOSIS — E1140 Type 2 diabetes mellitus with diabetic neuropathy, unspecified: Secondary | ICD-10-CM | POA: Diagnosis not present

## 2019-11-13 DIAGNOSIS — I2510 Atherosclerotic heart disease of native coronary artery without angina pectoris: Secondary | ICD-10-CM | POA: Diagnosis not present

## 2019-11-13 DIAGNOSIS — I10 Essential (primary) hypertension: Secondary | ICD-10-CM | POA: Diagnosis not present

## 2019-11-13 DIAGNOSIS — Z9861 Coronary angioplasty status: Secondary | ICD-10-CM | POA: Diagnosis not present

## 2019-11-25 DIAGNOSIS — D075 Carcinoma in situ of prostate: Secondary | ICD-10-CM | POA: Diagnosis not present

## 2019-11-25 DIAGNOSIS — N403 Nodular prostate with lower urinary tract symptoms: Secondary | ICD-10-CM | POA: Diagnosis not present

## 2019-11-25 DIAGNOSIS — R972 Elevated prostate specific antigen [PSA]: Secondary | ICD-10-CM | POA: Diagnosis not present

## 2019-12-17 DIAGNOSIS — E1140 Type 2 diabetes mellitus with diabetic neuropathy, unspecified: Secondary | ICD-10-CM | POA: Diagnosis not present

## 2019-12-17 DIAGNOSIS — I2510 Atherosclerotic heart disease of native coronary artery without angina pectoris: Secondary | ICD-10-CM | POA: Diagnosis not present

## 2019-12-17 DIAGNOSIS — I10 Essential (primary) hypertension: Secondary | ICD-10-CM | POA: Diagnosis not present

## 2019-12-17 DIAGNOSIS — M5459 Other low back pain: Secondary | ICD-10-CM | POA: Diagnosis not present

## 2020-03-02 DIAGNOSIS — Z9861 Coronary angioplasty status: Secondary | ICD-10-CM | POA: Diagnosis not present

## 2020-03-02 DIAGNOSIS — E1140 Type 2 diabetes mellitus with diabetic neuropathy, unspecified: Secondary | ICD-10-CM | POA: Diagnosis not present

## 2020-03-02 DIAGNOSIS — I2510 Atherosclerotic heart disease of native coronary artery without angina pectoris: Secondary | ICD-10-CM | POA: Diagnosis not present

## 2020-03-02 DIAGNOSIS — I10 Essential (primary) hypertension: Secondary | ICD-10-CM | POA: Diagnosis not present

## 2020-04-01 DIAGNOSIS — E1140 Type 2 diabetes mellitus with diabetic neuropathy, unspecified: Secondary | ICD-10-CM | POA: Diagnosis not present

## 2020-04-01 DIAGNOSIS — I10 Essential (primary) hypertension: Secondary | ICD-10-CM | POA: Diagnosis not present

## 2020-04-01 DIAGNOSIS — Z9861 Coronary angioplasty status: Secondary | ICD-10-CM | POA: Diagnosis not present

## 2020-04-01 DIAGNOSIS — I2510 Atherosclerotic heart disease of native coronary artery without angina pectoris: Secondary | ICD-10-CM | POA: Diagnosis not present

## 2020-04-30 DIAGNOSIS — R072 Precordial pain: Secondary | ICD-10-CM | POA: Diagnosis not present

## 2020-04-30 DIAGNOSIS — Z9861 Coronary angioplasty status: Secondary | ICD-10-CM | POA: Diagnosis not present

## 2020-04-30 DIAGNOSIS — E1140 Type 2 diabetes mellitus with diabetic neuropathy, unspecified: Secondary | ICD-10-CM | POA: Diagnosis not present

## 2020-04-30 DIAGNOSIS — I2510 Atherosclerotic heart disease of native coronary artery without angina pectoris: Secondary | ICD-10-CM | POA: Diagnosis not present

## 2020-07-01 DIAGNOSIS — E7849 Other hyperlipidemia: Secondary | ICD-10-CM | POA: Diagnosis not present

## 2020-07-01 DIAGNOSIS — I10 Essential (primary) hypertension: Secondary | ICD-10-CM | POA: Diagnosis not present

## 2020-07-01 DIAGNOSIS — D500 Iron deficiency anemia secondary to blood loss (chronic): Secondary | ICD-10-CM | POA: Diagnosis not present

## 2020-07-01 DIAGNOSIS — E119 Type 2 diabetes mellitus without complications: Secondary | ICD-10-CM | POA: Diagnosis not present

## 2020-07-14 DIAGNOSIS — E876 Hypokalemia: Secondary | ICD-10-CM | POA: Diagnosis not present

## 2020-07-14 DIAGNOSIS — E1165 Type 2 diabetes mellitus with hyperglycemia: Secondary | ICD-10-CM | POA: Diagnosis not present

## 2020-07-14 DIAGNOSIS — E7849 Other hyperlipidemia: Secondary | ICD-10-CM | POA: Diagnosis not present

## 2020-07-14 DIAGNOSIS — D649 Anemia, unspecified: Secondary | ICD-10-CM | POA: Diagnosis not present

## 2020-07-14 DIAGNOSIS — N429 Disorder of prostate, unspecified: Secondary | ICD-10-CM | POA: Diagnosis not present

## 2020-07-14 DIAGNOSIS — Z79899 Other long term (current) drug therapy: Secondary | ICD-10-CM | POA: Diagnosis not present

## 2020-09-02 DIAGNOSIS — I10 Essential (primary) hypertension: Secondary | ICD-10-CM | POA: Diagnosis not present

## 2020-09-02 DIAGNOSIS — D500 Iron deficiency anemia secondary to blood loss (chronic): Secondary | ICD-10-CM | POA: Diagnosis not present

## 2020-09-02 DIAGNOSIS — E119 Type 2 diabetes mellitus without complications: Secondary | ICD-10-CM | POA: Diagnosis not present

## 2020-09-02 DIAGNOSIS — E7849 Other hyperlipidemia: Secondary | ICD-10-CM | POA: Diagnosis not present

## 2020-10-01 DIAGNOSIS — Z9861 Coronary angioplasty status: Secondary | ICD-10-CM | POA: Diagnosis not present

## 2020-10-01 DIAGNOSIS — D500 Iron deficiency anemia secondary to blood loss (chronic): Secondary | ICD-10-CM | POA: Diagnosis not present

## 2020-10-01 DIAGNOSIS — I2510 Atherosclerotic heart disease of native coronary artery without angina pectoris: Secondary | ICD-10-CM | POA: Diagnosis not present

## 2020-10-01 DIAGNOSIS — I10 Essential (primary) hypertension: Secondary | ICD-10-CM | POA: Diagnosis not present

## 2020-10-01 DIAGNOSIS — E119 Type 2 diabetes mellitus without complications: Secondary | ICD-10-CM | POA: Diagnosis not present

## 2020-11-05 DIAGNOSIS — Z9861 Coronary angioplasty status: Secondary | ICD-10-CM | POA: Diagnosis not present

## 2020-11-05 DIAGNOSIS — I2510 Atherosclerotic heart disease of native coronary artery without angina pectoris: Secondary | ICD-10-CM | POA: Diagnosis not present

## 2020-11-05 DIAGNOSIS — D500 Iron deficiency anemia secondary to blood loss (chronic): Secondary | ICD-10-CM | POA: Diagnosis not present

## 2020-11-05 DIAGNOSIS — E119 Type 2 diabetes mellitus without complications: Secondary | ICD-10-CM | POA: Diagnosis not present

## 2020-11-05 DIAGNOSIS — I10 Essential (primary) hypertension: Secondary | ICD-10-CM | POA: Diagnosis not present

## 2021-02-01 DIAGNOSIS — Z9861 Coronary angioplasty status: Secondary | ICD-10-CM | POA: Diagnosis not present

## 2021-02-01 DIAGNOSIS — I2510 Atherosclerotic heart disease of native coronary artery without angina pectoris: Secondary | ICD-10-CM | POA: Diagnosis not present

## 2021-02-01 DIAGNOSIS — D500 Iron deficiency anemia secondary to blood loss (chronic): Secondary | ICD-10-CM | POA: Diagnosis not present

## 2021-02-01 DIAGNOSIS — E119 Type 2 diabetes mellitus without complications: Secondary | ICD-10-CM | POA: Diagnosis not present

## 2021-02-01 DIAGNOSIS — I10 Essential (primary) hypertension: Secondary | ICD-10-CM | POA: Diagnosis not present

## 2021-02-09 DIAGNOSIS — E876 Hypokalemia: Secondary | ICD-10-CM | POA: Diagnosis not present

## 2021-02-09 DIAGNOSIS — D649 Anemia, unspecified: Secondary | ICD-10-CM | POA: Diagnosis not present

## 2021-02-09 DIAGNOSIS — Z79899 Other long term (current) drug therapy: Secondary | ICD-10-CM | POA: Diagnosis not present

## 2021-02-09 DIAGNOSIS — E1165 Type 2 diabetes mellitus with hyperglycemia: Secondary | ICD-10-CM | POA: Diagnosis not present

## 2021-02-09 DIAGNOSIS — E7849 Other hyperlipidemia: Secondary | ICD-10-CM | POA: Diagnosis not present

## 2021-02-09 DIAGNOSIS — N429 Disorder of prostate, unspecified: Secondary | ICD-10-CM | POA: Diagnosis not present

## 2021-04-08 DIAGNOSIS — E119 Type 2 diabetes mellitus without complications: Secondary | ICD-10-CM | POA: Diagnosis not present

## 2021-04-08 DIAGNOSIS — I2510 Atherosclerotic heart disease of native coronary artery without angina pectoris: Secondary | ICD-10-CM | POA: Diagnosis not present

## 2021-04-08 DIAGNOSIS — I10 Essential (primary) hypertension: Secondary | ICD-10-CM | POA: Diagnosis not present

## 2021-04-08 DIAGNOSIS — Z9861 Coronary angioplasty status: Secondary | ICD-10-CM | POA: Diagnosis not present

## 2021-06-03 DIAGNOSIS — I2510 Atherosclerotic heart disease of native coronary artery without angina pectoris: Secondary | ICD-10-CM | POA: Diagnosis not present

## 2021-06-03 DIAGNOSIS — Z9861 Coronary angioplasty status: Secondary | ICD-10-CM | POA: Diagnosis not present

## 2021-06-03 DIAGNOSIS — E119 Type 2 diabetes mellitus without complications: Secondary | ICD-10-CM | POA: Diagnosis not present

## 2021-06-03 DIAGNOSIS — I10 Essential (primary) hypertension: Secondary | ICD-10-CM | POA: Diagnosis not present

## 2021-07-07 DIAGNOSIS — I2510 Atherosclerotic heart disease of native coronary artery without angina pectoris: Secondary | ICD-10-CM | POA: Diagnosis not present

## 2021-07-07 DIAGNOSIS — I10 Essential (primary) hypertension: Secondary | ICD-10-CM | POA: Diagnosis not present

## 2021-07-07 DIAGNOSIS — Z9861 Coronary angioplasty status: Secondary | ICD-10-CM | POA: Diagnosis not present

## 2021-07-07 DIAGNOSIS — E119 Type 2 diabetes mellitus without complications: Secondary | ICD-10-CM | POA: Diagnosis not present

## 2021-08-11 DIAGNOSIS — E7849 Other hyperlipidemia: Secondary | ICD-10-CM | POA: Diagnosis not present

## 2021-08-11 DIAGNOSIS — E876 Hypokalemia: Secondary | ICD-10-CM | POA: Diagnosis not present

## 2021-08-11 DIAGNOSIS — E1165 Type 2 diabetes mellitus with hyperglycemia: Secondary | ICD-10-CM | POA: Diagnosis not present

## 2021-08-11 DIAGNOSIS — D649 Anemia, unspecified: Secondary | ICD-10-CM | POA: Diagnosis not present

## 2021-08-11 DIAGNOSIS — N429 Disorder of prostate, unspecified: Secondary | ICD-10-CM | POA: Diagnosis not present

## 2021-08-11 DIAGNOSIS — Z79899 Other long term (current) drug therapy: Secondary | ICD-10-CM | POA: Diagnosis not present

## 2021-09-20 DIAGNOSIS — E119 Type 2 diabetes mellitus without complications: Secondary | ICD-10-CM | POA: Diagnosis not present

## 2021-09-20 DIAGNOSIS — I2510 Atherosclerotic heart disease of native coronary artery without angina pectoris: Secondary | ICD-10-CM | POA: Diagnosis not present

## 2021-09-20 DIAGNOSIS — I10 Essential (primary) hypertension: Secondary | ICD-10-CM | POA: Diagnosis not present

## 2021-09-20 DIAGNOSIS — Z9861 Coronary angioplasty status: Secondary | ICD-10-CM | POA: Diagnosis not present

## 2021-10-20 DIAGNOSIS — I2510 Atherosclerotic heart disease of native coronary artery without angina pectoris: Secondary | ICD-10-CM | POA: Diagnosis not present

## 2021-10-20 DIAGNOSIS — I10 Essential (primary) hypertension: Secondary | ICD-10-CM | POA: Diagnosis not present

## 2021-10-20 DIAGNOSIS — Z9861 Coronary angioplasty status: Secondary | ICD-10-CM | POA: Diagnosis not present

## 2021-10-20 DIAGNOSIS — E119 Type 2 diabetes mellitus without complications: Secondary | ICD-10-CM | POA: Diagnosis not present

## 2022-01-19 DIAGNOSIS — E119 Type 2 diabetes mellitus without complications: Secondary | ICD-10-CM | POA: Diagnosis not present

## 2022-01-19 DIAGNOSIS — I2510 Atherosclerotic heart disease of native coronary artery without angina pectoris: Secondary | ICD-10-CM | POA: Diagnosis not present

## 2022-01-19 DIAGNOSIS — I10 Essential (primary) hypertension: Secondary | ICD-10-CM | POA: Diagnosis not present

## 2022-01-19 DIAGNOSIS — Z9861 Coronary angioplasty status: Secondary | ICD-10-CM | POA: Diagnosis not present

## 2022-04-20 DIAGNOSIS — I10 Essential (primary) hypertension: Secondary | ICD-10-CM | POA: Diagnosis not present

## 2022-04-20 DIAGNOSIS — Z9861 Coronary angioplasty status: Secondary | ICD-10-CM | POA: Diagnosis not present

## 2022-04-20 DIAGNOSIS — E119 Type 2 diabetes mellitus without complications: Secondary | ICD-10-CM | POA: Diagnosis not present

## 2022-04-20 DIAGNOSIS — I2510 Atherosclerotic heart disease of native coronary artery without angina pectoris: Secondary | ICD-10-CM | POA: Diagnosis not present

## 2022-06-15 DIAGNOSIS — R972 Elevated prostate specific antigen [PSA]: Secondary | ICD-10-CM | POA: Diagnosis not present

## 2022-06-15 DIAGNOSIS — R351 Nocturia: Secondary | ICD-10-CM | POA: Diagnosis not present

## 2022-06-15 DIAGNOSIS — N403 Nodular prostate with lower urinary tract symptoms: Secondary | ICD-10-CM | POA: Diagnosis not present

## 2022-06-15 DIAGNOSIS — R3914 Feeling of incomplete bladder emptying: Secondary | ICD-10-CM | POA: Diagnosis not present

## 2022-06-15 DIAGNOSIS — N401 Enlarged prostate with lower urinary tract symptoms: Secondary | ICD-10-CM | POA: Diagnosis not present

## 2022-06-17 ENCOUNTER — Other Ambulatory Visit: Payer: Self-pay | Admitting: Urology

## 2022-06-17 DIAGNOSIS — R972 Elevated prostate specific antigen [PSA]: Secondary | ICD-10-CM

## 2022-07-18 DIAGNOSIS — I10 Essential (primary) hypertension: Secondary | ICD-10-CM | POA: Diagnosis not present

## 2022-07-18 DIAGNOSIS — E119 Type 2 diabetes mellitus without complications: Secondary | ICD-10-CM | POA: Diagnosis not present

## 2022-07-18 DIAGNOSIS — I2510 Atherosclerotic heart disease of native coronary artery without angina pectoris: Secondary | ICD-10-CM | POA: Diagnosis not present

## 2022-07-18 DIAGNOSIS — Z9861 Coronary angioplasty status: Secondary | ICD-10-CM | POA: Diagnosis not present

## 2022-09-22 ENCOUNTER — Ambulatory Visit
Admission: RE | Admit: 2022-09-22 | Discharge: 2022-09-22 | Disposition: A | Discharge status: Home / Self Care | Payer: Medicare HMO | Source: Ambulatory Visit | Attending: Urology | Admitting: Urology

## 2022-09-22 DIAGNOSIS — N401 Enlarged prostate with lower urinary tract symptoms: Secondary | ICD-10-CM | POA: Diagnosis not present

## 2022-09-22 DIAGNOSIS — R972 Elevated prostate specific antigen [PSA]: Secondary | ICD-10-CM

## 2022-09-22 DIAGNOSIS — E876 Hypokalemia: Secondary | ICD-10-CM | POA: Diagnosis not present

## 2022-09-22 DIAGNOSIS — E034 Atrophy of thyroid (acquired): Secondary | ICD-10-CM | POA: Diagnosis not present

## 2022-09-22 DIAGNOSIS — E7849 Other hyperlipidemia: Secondary | ICD-10-CM | POA: Diagnosis not present

## 2022-09-22 DIAGNOSIS — D649 Anemia, unspecified: Secondary | ICD-10-CM | POA: Diagnosis not present

## 2022-09-22 DIAGNOSIS — N429 Disorder of prostate, unspecified: Secondary | ICD-10-CM | POA: Diagnosis not present

## 2022-09-22 DIAGNOSIS — Z79899 Other long term (current) drug therapy: Secondary | ICD-10-CM | POA: Diagnosis not present

## 2022-09-22 DIAGNOSIS — E1165 Type 2 diabetes mellitus with hyperglycemia: Secondary | ICD-10-CM | POA: Diagnosis not present

## 2022-09-22 MED ORDER — GADOPICLENOL 0.5 MMOL/ML IV SOLN
6.0000 mL | Freq: Once | INTRAVENOUS | Status: AC | PRN
  Administered 2022-09-22: 6 mL via INTRAVENOUS

## 2022-11-17 DIAGNOSIS — H52223 Regular astigmatism, bilateral: Secondary | ICD-10-CM | POA: Diagnosis not present

## 2022-11-17 DIAGNOSIS — H5203 Hypermetropia, bilateral: Secondary | ICD-10-CM | POA: Diagnosis not present

## 2022-11-17 DIAGNOSIS — H524 Presbyopia: Secondary | ICD-10-CM | POA: Diagnosis not present

## 2022-11-17 DIAGNOSIS — D3131 Benign neoplasm of right choroid: Secondary | ICD-10-CM | POA: Diagnosis not present

## 2022-11-17 DIAGNOSIS — Z135 Encounter for screening for eye and ear disorders: Secondary | ICD-10-CM | POA: Diagnosis not present

## 2022-11-17 DIAGNOSIS — H2513 Age-related nuclear cataract, bilateral: Secondary | ICD-10-CM | POA: Diagnosis not present

## 2022-11-17 DIAGNOSIS — D3132 Benign neoplasm of left choroid: Secondary | ICD-10-CM | POA: Diagnosis not present

## 2023-04-11 DIAGNOSIS — R972 Elevated prostate specific antigen [PSA]: Secondary | ICD-10-CM | POA: Diagnosis not present

## 2023-04-18 DIAGNOSIS — R3914 Feeling of incomplete bladder emptying: Secondary | ICD-10-CM | POA: Diagnosis not present

## 2023-04-18 DIAGNOSIS — N401 Enlarged prostate with lower urinary tract symptoms: Secondary | ICD-10-CM | POA: Diagnosis not present

## 2023-04-18 DIAGNOSIS — N403 Nodular prostate with lower urinary tract symptoms: Secondary | ICD-10-CM | POA: Diagnosis not present

## 2023-04-18 DIAGNOSIS — R972 Elevated prostate specific antigen [PSA]: Secondary | ICD-10-CM | POA: Diagnosis not present

## 2023-04-28 DIAGNOSIS — N210 Calculus in bladder: Secondary | ICD-10-CM | POA: Diagnosis not present

## 2023-04-28 DIAGNOSIS — R3914 Feeling of incomplete bladder emptying: Secondary | ICD-10-CM | POA: Diagnosis not present

## 2023-04-28 DIAGNOSIS — R310 Gross hematuria: Secondary | ICD-10-CM | POA: Diagnosis not present

## 2023-04-28 DIAGNOSIS — N401 Enlarged prostate with lower urinary tract symptoms: Secondary | ICD-10-CM | POA: Diagnosis not present

## 2023-04-28 DIAGNOSIS — R338 Other retention of urine: Secondary | ICD-10-CM | POA: Diagnosis not present

## 2023-04-28 DIAGNOSIS — R972 Elevated prostate specific antigen [PSA]: Secondary | ICD-10-CM | POA: Diagnosis not present

## 2023-05-17 DIAGNOSIS — N411 Chronic prostatitis: Secondary | ICD-10-CM | POA: Diagnosis not present

## 2023-05-17 DIAGNOSIS — N4289 Other specified disorders of prostate: Secondary | ICD-10-CM | POA: Diagnosis not present

## 2023-05-17 DIAGNOSIS — C61 Malignant neoplasm of prostate: Secondary | ICD-10-CM | POA: Diagnosis not present

## 2023-05-24 DIAGNOSIS — N403 Nodular prostate with lower urinary tract symptoms: Secondary | ICD-10-CM | POA: Diagnosis not present

## 2023-05-24 DIAGNOSIS — R3914 Feeling of incomplete bladder emptying: Secondary | ICD-10-CM | POA: Diagnosis not present

## 2023-05-24 DIAGNOSIS — N210 Calculus in bladder: Secondary | ICD-10-CM | POA: Diagnosis not present

## 2023-05-24 DIAGNOSIS — N401 Enlarged prostate with lower urinary tract symptoms: Secondary | ICD-10-CM | POA: Diagnosis not present

## 2023-05-24 DIAGNOSIS — R310 Gross hematuria: Secondary | ICD-10-CM | POA: Diagnosis not present

## 2023-05-24 DIAGNOSIS — C61 Malignant neoplasm of prostate: Secondary | ICD-10-CM | POA: Diagnosis not present

## 2023-11-22 DIAGNOSIS — C61 Malignant neoplasm of prostate: Secondary | ICD-10-CM | POA: Diagnosis not present

## 2023-11-28 DIAGNOSIS — R399 Unspecified symptoms and signs involving the genitourinary system: Secondary | ICD-10-CM | POA: Diagnosis not present

## 2023-11-29 DIAGNOSIS — N401 Enlarged prostate with lower urinary tract symptoms: Secondary | ICD-10-CM | POA: Diagnosis not present

## 2023-11-29 DIAGNOSIS — C61 Malignant neoplasm of prostate: Secondary | ICD-10-CM | POA: Diagnosis not present

## 2024-03-26 ENCOUNTER — Encounter: Payer: Self-pay | Admitting: *Deleted

## 2024-03-26 NOTE — Progress Notes (Signed)
 Bryan Roy                                          MRN: [id number]   03/26/2024   The [facility] Quality Team Specialist reviewed this patient medical record for the purposes of chart review for care gap closure. The following were reviewed: chart review for care gap closure-controlling blood pressure and kidney health evaluation for diabetes:eGFR  and uACR.    [facility]BCI Quality Team
